# Patient Record
Sex: Female | Born: 1935 | Race: White | Hispanic: No | Smoking: Former smoker
Health system: Southern US, Community
[De-identification: ages and names within clinical notes are randomized; demographics above are authoritative.]

## PROBLEM LIST (undated history)

## (undated) DIAGNOSIS — F329 Major depressive disorder, single episode, unspecified: Secondary | ICD-10-CM

## (undated) DIAGNOSIS — O2230 Deep phlebothrombosis in pregnancy, unspecified trimester: Secondary | ICD-10-CM

## (undated) DIAGNOSIS — F32A Depression, unspecified: Secondary | ICD-10-CM

## (undated) DIAGNOSIS — J45909 Unspecified asthma, uncomplicated: Secondary | ICD-10-CM

## (undated) DIAGNOSIS — M48061 Spinal stenosis, lumbar region without neurogenic claudication: Secondary | ICD-10-CM

## (undated) DIAGNOSIS — E785 Hyperlipidemia, unspecified: Secondary | ICD-10-CM

## (undated) DIAGNOSIS — M419 Scoliosis, unspecified: Secondary | ICD-10-CM

## (undated) DIAGNOSIS — F0390 Unspecified dementia without behavioral disturbance: Secondary | ICD-10-CM

## (undated) DIAGNOSIS — D649 Anemia, unspecified: Secondary | ICD-10-CM

## (undated) DIAGNOSIS — M810 Age-related osteoporosis without current pathological fracture: Secondary | ICD-10-CM

## (undated) DIAGNOSIS — R4702 Dysphasia: Secondary | ICD-10-CM

## (undated) DIAGNOSIS — I10 Essential (primary) hypertension: Secondary | ICD-10-CM

## (undated) DIAGNOSIS — E039 Hypothyroidism, unspecified: Secondary | ICD-10-CM

## (undated) DIAGNOSIS — F419 Anxiety disorder, unspecified: Secondary | ICD-10-CM

## (undated) DIAGNOSIS — K219 Gastro-esophageal reflux disease without esophagitis: Secondary | ICD-10-CM

## (undated) HISTORY — PX: OTHER SURGICAL HISTORY: SHX169

---

## 2004-05-14 ENCOUNTER — Ambulatory Visit: Payer: Self-pay | Admitting: Family Medicine

## 2004-10-28 ENCOUNTER — Ambulatory Visit: Payer: Self-pay | Admitting: Gastroenterology

## 2005-04-21 ENCOUNTER — Ambulatory Visit: Payer: Self-pay | Admitting: Anesthesiology

## 2005-04-29 ENCOUNTER — Ambulatory Visit: Payer: Self-pay | Admitting: Anesthesiology

## 2005-05-18 ENCOUNTER — Ambulatory Visit: Payer: Self-pay | Admitting: Unknown Physician Specialty

## 2005-10-27 ENCOUNTER — Ambulatory Visit: Payer: Self-pay | Admitting: Unknown Physician Specialty

## 2005-11-24 ENCOUNTER — Other Ambulatory Visit: Payer: Self-pay

## 2005-12-01 ENCOUNTER — Inpatient Hospital Stay: Payer: Self-pay | Admitting: Unknown Physician Specialty

## 2006-05-19 ENCOUNTER — Ambulatory Visit: Payer: Self-pay | Admitting: Unknown Physician Specialty

## 2006-12-21 ENCOUNTER — Ambulatory Visit: Payer: Self-pay | Admitting: Internal Medicine

## 2007-02-01 ENCOUNTER — Ambulatory Visit: Payer: Self-pay | Admitting: Internal Medicine

## 2007-05-24 ENCOUNTER — Ambulatory Visit: Payer: Self-pay | Admitting: Internal Medicine

## 2008-05-25 ENCOUNTER — Ambulatory Visit: Payer: Self-pay | Admitting: Internal Medicine

## 2008-08-12 ENCOUNTER — Emergency Department: Payer: Self-pay | Admitting: Emergency Medicine

## 2009-05-27 ENCOUNTER — Ambulatory Visit: Payer: Self-pay | Admitting: Internal Medicine

## 2009-07-28 ENCOUNTER — Emergency Department: Payer: Self-pay | Admitting: Internal Medicine

## 2009-08-02 ENCOUNTER — Emergency Department: Payer: Self-pay | Admitting: Unknown Physician Specialty

## 2009-08-09 ENCOUNTER — Inpatient Hospital Stay: Payer: Self-pay | Admitting: Internal Medicine

## 2009-10-01 ENCOUNTER — Ambulatory Visit: Payer: Self-pay | Admitting: Unknown Physician Specialty

## 2012-01-19 ENCOUNTER — Emergency Department: Payer: Self-pay | Admitting: Emergency Medicine

## 2012-02-01 ENCOUNTER — Inpatient Hospital Stay: Payer: Self-pay | Admitting: Internal Medicine

## 2012-02-01 LAB — CBC WITH DIFFERENTIAL/PLATELET
Basophil %: 0.7 %
Eosinophil #: 0.3 10*3/uL (ref 0.0–0.7)
Eosinophil %: 2 %
HCT: 41 % (ref 35.0–47.0)
HGB: 13.7 g/dL (ref 12.0–16.0)
Lymphocyte #: 1.8 10*3/uL (ref 1.0–3.6)
MCH: 29.5 pg (ref 26.0–34.0)
MCHC: 33.5 g/dL (ref 32.0–36.0)
Monocyte #: 0.7 x10 3/mm (ref 0.2–0.9)
Monocyte %: 4.9 %
Neutrophil %: 79.8 %
Platelet: 248 10*3/uL (ref 150–440)
RBC: 4.65 10*6/uL (ref 3.80–5.20)

## 2012-02-01 LAB — COMPREHENSIVE METABOLIC PANEL
Alkaline Phosphatase: 168 U/L — ABNORMAL HIGH (ref 50–136)
Anion Gap: 14 (ref 7–16)
BUN: 29 mg/dL — ABNORMAL HIGH (ref 7–18)
Bilirubin,Total: 1 mg/dL (ref 0.2–1.0)
Calcium, Total: 9.9 mg/dL (ref 8.5–10.1)
Co2: 23 mmol/L (ref 21–32)
EGFR (African American): 40 — ABNORMAL LOW
Osmolality: 288 (ref 275–301)
SGOT(AST): 17 U/L (ref 15–37)
SGPT (ALT): 16 U/L (ref 12–78)
Sodium: 139 mmol/L (ref 136–145)
Total Protein: 7.2 g/dL (ref 6.4–8.2)

## 2012-02-01 LAB — URINALYSIS, COMPLETE
Bilirubin,UR: NEGATIVE
Glucose,UR: NEGATIVE mg/dL (ref 0–75)
Hyaline Cast: 3
Nitrite: POSITIVE
Protein: 30
Specific Gravity: 1.023 (ref 1.003–1.030)

## 2012-02-01 LAB — TSH: Thyroid Stimulating Horm: 12.8 u[IU]/mL — ABNORMAL HIGH

## 2012-02-01 LAB — TROPONIN I: Troponin-I: 0.67 ng/mL — ABNORMAL HIGH

## 2012-02-01 LAB — APTT: Activated PTT: 131.2 secs — ABNORMAL HIGH (ref 23.6–35.9)

## 2012-02-02 LAB — LIPID PANEL
HDL Cholesterol: 35 mg/dL — ABNORMAL LOW (ref 40–60)
Ldl Cholesterol, Calc: 106 mg/dL — ABNORMAL HIGH (ref 0–100)
Triglycerides: 91 mg/dL (ref 0–200)
VLDL Cholesterol, Calc: 18 mg/dL (ref 5–40)

## 2012-02-02 LAB — CBC WITH DIFFERENTIAL/PLATELET
HCT: 34.9 % — ABNORMAL LOW (ref 35.0–47.0)
Lymphocyte #: 1.8 10*3/uL (ref 1.0–3.6)
Lymphocyte %: 20.8 %
Monocyte %: 7.1 %
Neutrophil %: 68.3 %
Platelet: 163 10*3/uL (ref 150–440)
RDW: 14.5 % (ref 11.5–14.5)
WBC: 8.7 10*3/uL (ref 3.6–11.0)

## 2012-02-02 LAB — APTT
Activated PTT: >160 secs (ref 23.6–35.9)
Activated PTT: >160 secs (ref 23.6–35.9)
Activated PTT: 125.5 secs — ABNORMAL HIGH (ref 23.6–35.9)

## 2012-02-02 LAB — BASIC METABOLIC PANEL
BUN: 25 mg/dL — ABNORMAL HIGH (ref 7–18)
Chloride: 108 mmol/L — ABNORMAL HIGH (ref 98–107)
Creatinine: 1.19 mg/dL (ref 0.60–1.30)
EGFR (African American): 49 — ABNORMAL LOW
EGFR (Non-African Amer.): 42 — ABNORMAL LOW
Glucose: 126 mg/dL — ABNORMAL HIGH (ref 65–99)
Osmolality: 287 (ref 275–301)
Potassium: 3.9 mmol/L (ref 3.5–5.1)

## 2012-02-02 LAB — TROPONIN I: Troponin-I: 0.67 ng/mL — ABNORMAL HIGH

## 2012-02-02 LAB — HEMOGLOBIN A1C: Hemoglobin A1C: 5.2 % (ref 4.2–6.3)

## 2012-02-02 LAB — PROTIME-INR
INR: 1.1
Prothrombin Time: 14.4 secs (ref 11.5–14.7)

## 2012-02-03 LAB — BASIC METABOLIC PANEL
Anion Gap: 7 (ref 7–16)
Calcium, Total: 8.5 mg/dL (ref 8.5–10.1)
Chloride: 112 mmol/L — ABNORMAL HIGH (ref 98–107)
Co2: 25 mmol/L (ref 21–32)
Creatinine: 1.18 mg/dL (ref 0.60–1.30)
EGFR (African American): 49 — ABNORMAL LOW
Osmolality: 288 (ref 275–301)

## 2012-02-03 LAB — PROTIME-INR
INR: 1
Prothrombin Time: 14 secs (ref 11.5–14.7)

## 2012-02-03 LAB — BUN: BUN: 12 mg/dL (ref 7–18)

## 2012-02-03 LAB — APTT
Activated PTT: 50.1 secs — ABNORMAL HIGH (ref 23.6–35.9)
Activated PTT: 50 secs — ABNORMAL HIGH (ref 23.6–35.9)

## 2012-02-03 LAB — CREATININE, SERUM: EGFR (Non-African Amer.): 43 — ABNORMAL LOW

## 2012-02-04 LAB — PROTIME-INR
INR: 1.1
Prothrombin Time: 14.3 secs (ref 11.5–14.7)

## 2012-02-04 LAB — APTT: Activated PTT: 109.4 secs — ABNORMAL HIGH (ref 23.6–35.9)

## 2012-02-05 LAB — PROTIME-INR
INR: 1.4
Prothrombin Time: 17.7 secs — ABNORMAL HIGH (ref 11.5–14.7)

## 2012-02-06 LAB — APTT: Activated PTT: 78.4 secs — ABNORMAL HIGH (ref 23.6–35.9)

## 2012-02-06 LAB — HEMOGLOBIN: HGB: 11.5 g/dL — ABNORMAL LOW (ref 12.0–16.0)

## 2012-02-07 LAB — APTT: Activated PTT: 38.3 secs — ABNORMAL HIGH (ref 23.6–35.9)

## 2012-02-07 LAB — PROTIME-INR
INR: 2.8
Prothrombin Time: 29.4 secs — ABNORMAL HIGH (ref 11.5–14.7)

## 2012-02-07 LAB — HEMOGLOBIN: HGB: 11.6 g/dL — ABNORMAL LOW (ref 12.0–16.0)

## 2014-05-27 ENCOUNTER — Inpatient Hospital Stay: Payer: Self-pay | Admitting: Internal Medicine

## 2014-05-27 LAB — COMPREHENSIVE METABOLIC PANEL
ALT: 27 U/L
ANION GAP: 6 — AB (ref 7–16)
Albumin: 3.6 g/dL (ref 3.4–5.0)
Alkaline Phosphatase: 96 U/L
BILIRUBIN TOTAL: 0.5 mg/dL (ref 0.2–1.0)
BUN: 26 mg/dL — ABNORMAL HIGH (ref 7–18)
CHLORIDE: 107 mmol/L (ref 98–107)
CREATININE: 1.42 mg/dL — AB (ref 0.60–1.30)
Calcium, Total: 8.8 mg/dL (ref 8.5–10.1)
Co2: 26 mmol/L (ref 21–32)
EGFR (African American): 45 — ABNORMAL LOW
EGFR (Non-African Amer.): 37 — ABNORMAL LOW
Glucose: 164 mg/dL — ABNORMAL HIGH (ref 65–99)
Osmolality: 286 (ref 275–301)
Potassium: 4.5 mmol/L (ref 3.5–5.1)
SGOT(AST): 26 U/L (ref 15–37)
Sodium: 139 mmol/L (ref 136–145)
TOTAL PROTEIN: 6.9 g/dL (ref 6.4–8.2)

## 2014-05-27 LAB — URINALYSIS, COMPLETE
BILIRUBIN, UR: NEGATIVE
GLUCOSE, UR: NEGATIVE mg/dL (ref 0–75)
Nitrite: NEGATIVE
PH: 5 (ref 4.5–8.0)
Protein: 30
Specific Gravity: 1.017 (ref 1.003–1.030)
Squamous Epithelial: 13

## 2014-05-27 LAB — CBC
HCT: 40.5 % (ref 35.0–47.0)
HGB: 13.1 g/dL (ref 12.0–16.0)
MCH: 29.5 pg (ref 26.0–34.0)
MCHC: 32.4 g/dL (ref 32.0–36.0)
MCV: 91 fL (ref 80–100)
PLATELETS: 208 10*3/uL (ref 150–440)
RBC: 4.45 10*6/uL (ref 3.80–5.20)
RDW: 15.2 % — ABNORMAL HIGH (ref 11.5–14.5)
WBC: 16 10*3/uL — AB (ref 3.6–11.0)

## 2014-05-27 LAB — PROTIME-INR
INR: 2
Prothrombin Time: 22 secs — ABNORMAL HIGH (ref 11.5–14.7)

## 2014-05-27 LAB — TROPONIN I: Troponin-I: <0.02 ng/mL

## 2014-05-28 LAB — HEMOGLOBIN: HGB: 12 g/dL (ref 12.0–16.0)

## 2014-05-28 LAB — CBC WITH DIFFERENTIAL/PLATELET
BASOS ABS: 0 10*3/uL (ref 0.0–0.1)
Basophil %: 0.4 %
EOS PCT: 0.7 %
Eosinophil #: 0.1 10*3/uL (ref 0.0–0.7)
HCT: 35.1 % (ref 35.0–47.0)
HGB: 11.4 g/dL — ABNORMAL LOW (ref 12.0–16.0)
LYMPHS ABS: 1.5 10*3/uL (ref 1.0–3.6)
Lymphocyte %: 13.7 %
MCH: 29.7 pg (ref 26.0–34.0)
MCHC: 32.6 g/dL (ref 32.0–36.0)
MCV: 91 fL (ref 80–100)
Monocyte #: 0.7 x10 3/mm (ref 0.2–0.9)
Monocyte %: 6.8 %
Neutrophil #: 8.6 10*3/uL — ABNORMAL HIGH (ref 1.4–6.5)
Neutrophil %: 78.4 %
PLATELETS: 172 10*3/uL (ref 150–440)
RBC: 3.85 10*6/uL (ref 3.80–5.20)
RDW: 15 % — ABNORMAL HIGH (ref 11.5–14.5)
WBC: 11 10*3/uL (ref 3.6–11.0)

## 2014-05-28 LAB — BASIC METABOLIC PANEL
Anion Gap: 6 — ABNORMAL LOW (ref 7–16)
BUN: 19 mg/dL — ABNORMAL HIGH (ref 7–18)
CO2: 26 mmol/L (ref 21–32)
Calcium, Total: 8.2 mg/dL — ABNORMAL LOW (ref 8.5–10.1)
Chloride: 108 mmol/L — ABNORMAL HIGH (ref 98–107)
Creatinine: 1.19 mg/dL (ref 0.60–1.30)
EGFR (African American): 55 — ABNORMAL LOW
GFR CALC NON AF AMER: 46 — AB
GLUCOSE: 122 mg/dL — AB (ref 65–99)
Osmolality: 283 (ref 275–301)
Potassium: 4.2 mmol/L (ref 3.5–5.1)
Sodium: 140 mmol/L (ref 136–145)

## 2014-05-28 LAB — CLOSTRIDIUM DIFFICILE(ARMC)

## 2014-05-28 LAB — PROTIME-INR
INR: 2.3
Prothrombin Time: 24.9 secs — ABNORMAL HIGH (ref 11.5–14.7)

## 2014-05-29 LAB — PROTIME-INR
INR: 1.5
Prothrombin Time: 18 secs — ABNORMAL HIGH (ref 11.5–14.7)

## 2014-05-29 LAB — URINE CULTURE

## 2014-05-30 LAB — STOOL CULTURE

## 2014-05-30 LAB — WBCS, STOOL

## 2014-06-04 ENCOUNTER — Inpatient Hospital Stay: Payer: Self-pay | Admitting: Internal Medicine

## 2014-06-04 LAB — CBC WITH DIFFERENTIAL/PLATELET
Basophil #: 0.1 10*3/uL (ref 0.0–0.1)
Basophil %: 0.9 %
Eosinophil #: 0.5 10*3/uL (ref 0.0–0.7)
Eosinophil %: 5.8 %
HCT: 33.7 % — AB (ref 35.0–47.0)
HGB: 11.2 g/dL — ABNORMAL LOW (ref 12.0–16.0)
LYMPHS ABS: 1.8 10*3/uL (ref 1.0–3.6)
Lymphocyte %: 21.1 %
MCH: 29.8 pg (ref 26.0–34.0)
MCHC: 33.2 g/dL (ref 32.0–36.0)
MCV: 90 fL (ref 80–100)
Monocyte #: 0.7 x10 3/mm (ref 0.2–0.9)
Monocyte %: 8.8 %
Neutrophil #: 5.3 10*3/uL (ref 1.4–6.5)
Neutrophil %: 63.4 %
PLATELETS: 216 10*3/uL (ref 150–440)
RBC: 3.76 10*6/uL — AB (ref 3.80–5.20)
RDW: 15.8 % — ABNORMAL HIGH (ref 11.5–14.5)
WBC: 8.4 10*3/uL (ref 3.6–11.0)

## 2014-06-04 LAB — URINALYSIS, COMPLETE
Bilirubin,UR: NEGATIVE
GLUCOSE, UR: NEGATIVE mg/dL (ref 0–75)
KETONE: NEGATIVE
NITRITE: NEGATIVE
Ph: 5 (ref 4.5–8.0)
Protein: 30
RBC,UR: 30 /HPF (ref 0–5)
Specific Gravity: 1.02 (ref 1.003–1.030)
Squamous Epithelial: 2

## 2014-06-04 LAB — COMPREHENSIVE METABOLIC PANEL
Albumin: 2.9 g/dL — ABNORMAL LOW (ref 3.4–5.0)
Alkaline Phosphatase: 52 U/L
Anion Gap: 6 — ABNORMAL LOW (ref 7–16)
BILIRUBIN TOTAL: 0.6 mg/dL (ref 0.2–1.0)
BUN: 23 mg/dL — AB (ref 7–18)
Calcium, Total: 8.2 mg/dL — ABNORMAL LOW (ref 8.5–10.1)
Chloride: 112 mmol/L — ABNORMAL HIGH (ref 98–107)
Co2: 26 mmol/L (ref 21–32)
Creatinine: 1.02 mg/dL (ref 0.60–1.30)
EGFR (African American): >60
EGFR (Non-African Amer.): 55 — ABNORMAL LOW
GLUCOSE: 102 mg/dL — AB (ref 65–99)
Osmolality: 291 (ref 275–301)
Potassium: 3.3 mmol/L — ABNORMAL LOW (ref 3.5–5.1)
SGOT(AST): 40 U/L — ABNORMAL HIGH (ref 15–37)
SGPT (ALT): 30 U/L
SODIUM: 144 mmol/L (ref 136–145)
Total Protein: 5.9 g/dL — ABNORMAL LOW (ref 6.4–8.2)

## 2014-06-04 LAB — TROPONIN I: Troponin-I: 0.02 ng/mL

## 2014-06-04 LAB — LIPASE, BLOOD: Lipase: 173 U/L (ref 73–393)

## 2014-06-05 LAB — CBC WITH DIFFERENTIAL/PLATELET
BASOS ABS: 0.1 10*3/uL (ref 0.0–0.1)
Basophil %: 1.1 %
Eosinophil #: 0.5 10*3/uL (ref 0.0–0.7)
Eosinophil %: 6.1 %
HCT: 32.3 % — ABNORMAL LOW (ref 35.0–47.0)
HGB: 11 g/dL — AB (ref 12.0–16.0)
Lymphocyte #: 1.4 10*3/uL (ref 1.0–3.6)
Lymphocyte %: 17.8 %
MCH: 30.3 pg (ref 26.0–34.0)
MCHC: 34 g/dL (ref 32.0–36.0)
MCV: 89 fL (ref 80–100)
Monocyte #: 0.6 x10 3/mm (ref 0.2–0.9)
Monocyte %: 8.1 %
NEUTROS ABS: 5.2 10*3/uL (ref 1.4–6.5)
Neutrophil %: 66.9 %
Platelet: 202 10*3/uL (ref 150–440)
RBC: 3.62 10*6/uL — AB (ref 3.80–5.20)
RDW: 15.6 % — ABNORMAL HIGH (ref 11.5–14.5)
WBC: 7.8 10*3/uL (ref 3.6–11.0)

## 2014-06-05 LAB — BASIC METABOLIC PANEL
ANION GAP: 8 (ref 7–16)
BUN: 19 mg/dL — ABNORMAL HIGH (ref 7–18)
CALCIUM: 7.6 mg/dL — AB (ref 8.5–10.1)
Chloride: 114 mmol/L — ABNORMAL HIGH (ref 98–107)
Co2: 24 mmol/L (ref 21–32)
Creatinine: 0.95 mg/dL (ref 0.60–1.30)
EGFR (African American): >60
EGFR (Non-African Amer.): 59 — ABNORMAL LOW
Glucose: 99 mg/dL (ref 65–99)
Osmolality: 293 (ref 275–301)
Potassium: 3.3 mmol/L — ABNORMAL LOW (ref 3.5–5.1)
Sodium: 146 mmol/L — ABNORMAL HIGH (ref 136–145)

## 2014-06-06 LAB — BASIC METABOLIC PANEL
Anion Gap: 6 — ABNORMAL LOW (ref 7–16)
BUN: 14 mg/dL (ref 7–18)
CALCIUM: 7.5 mg/dL — AB (ref 8.5–10.1)
CHLORIDE: 113 mmol/L — AB (ref 98–107)
Co2: 24 mmol/L (ref 21–32)
Creatinine: 0.88 mg/dL (ref 0.60–1.30)
EGFR (African American): >60
EGFR (Non-African Amer.): >60
Glucose: 111 mg/dL — ABNORMAL HIGH (ref 65–99)
Osmolality: 286 (ref 275–301)
Potassium: 3.5 mmol/L (ref 3.5–5.1)
Sodium: 143 mmol/L (ref 136–145)

## 2014-06-06 LAB — URINE CULTURE

## 2014-10-02 NOTE — Consult Note (Signed)
General Aspect DVT with bilateral PE evaluate for IVC filter    Present Illness 81 year old female with a history of hypertension, asthma, hyperthyroidism, lumbar scoliosis, lower back pain, osteoporosis presented to the ED 6 days ago with shortness of breath.   Her husband found her breath, so her husband called EMS and brought patient to ED for further evaluation. Patient was noted to have elevated troponin at 0.53, was treated with aspirin 325 mg. Further work up revealed bilateral PE and DVT of thelower extremity.  She was placed on heparin and bridged to Coumadin.  At this time she is therapeutic on Coumadin and has not had any significant complications from her anticoagulation therapy.  PAST MEDICAL HISTORY: As mentioned above:  1. Hypertension. 2. Asthma. 3. Hypothyroidism.  4. Gastroesophageal reflux disease. 5. Osteoporosis.  6. Chronic back pain. 7. Lumbar scoliosis. 8. Possible anxiety and depression.   Home Medications: Medication Instructions Status  albuterol-ipratropium 2.5 mg-0.5 mg/3 mL inhalation solution  inhaled  Active  metoprolol tartrate 25 mg oral tablet 1 tab(s) orally 2 times a day Active  simvastatin 40 mg oral tablet 1 tab(s) orally once a day (at bedtime) Active  warfarin 5 mg oral tablet 0.5 tab(s) orally once a day x 30 days Active  nitroglycerin 0.4 mg sublingual tablet 1 tab(s) sublingual , As needed, chest pain Active  lisinopril 5 mg oral tablet 1 tab(s) orally once a day Active  ferrous sulfate 325 mg (65 mg elemental iron) oral tablet 1 tab(s) orally once a day Active  risperidone 0.25 mg oral tablet 0.5 tab(s) orally 2 times a day Active  levothyroxine 200 mcg (0.2 mg) oral tablet 1 tab(s) orally once a day Active    PCN: Rash  Case History:   Family History Non-Contributory    Social History negative tobacco, negative ETOH, negative Illicit drugs   Review of Systems:   ROS Pt not able to provide ROS  confused   Physical Exam:   GEN  well developed, no acute distress    HEENT PERRL, hearing intact to voice    NECK supple  trachea midline    RESP normal resp effort  no use of accessory muscles    CARD regular rate  no JVD    ABD denies tenderness  soft  nondistended    EXTR negative cyanosis/clubbing, positive edema    SKIN No rashes, No ulcers, skin turgor poor    NEURO cranial nerves intact, follows commands, motor/sensory function intact    PSYCH alert, poor insight   Nursing/Ancillary Notes: **Vital Signs.:   25-Aug-13 08:05   Vital Signs Type Routine   Temperature Temperature (F) 97.7   Celsius 36.5   Temperature Source oral   Pulse Pulse 80   Respirations Respirations 18   Systolic BP Systolic BP 563   Diastolic BP (mmHg) Diastolic BP (mmHg) 65   Mean BP 90   Pulse Ox % Pulse Ox % 97   Pulse Ox Activity Level  At rest   Oxygen Delivery Room Air/ 21 %   Routine Chem:  21-Aug-13 05:26    Glucose, Serum  125   BUN 12   Creatinine (comp) 1.18   Sodium, Serum 144   Potassium, Serum 3.7   Chloride, Serum  112   CO2, Serum 25   Calcium (Total), Serum 8.5   Anion Gap 7   Osmolality (calc) 288   eGFR (African American)  49   eGFR (Non-African American)  43 (eGFR values <20m/min/1.73 m2  may be an indication of chronic kidney disease (CKD). Calculated eGFR is useful in patients with stable renal function. The eGFR calculation will not be reliable in acutely ill patients when serum creatinine is changing rapidly. It is not useful in  patients on dialysis. The eGFR calculation may not be applicable to patients at the low and high extremes of body sizes, pregnant women, and vegetarians.)  Routine Coag:  25-Aug-13 04:08    Activated PTT (APTT)  38.3 (A HCT value >55% may artifactually increase the APTT. In one study, the increase was an average of 19%. Reference: "Effect on Routine and Special Coagulation Testing Values of Citrate Anticoagulant Adjustment in Patients with High HCT  Values." American Journal of Clinical Pathology 2006;126:400-405.)   Prothrombin  29.4   INR 2.8 (INR reference interval applies to patients on anticoagulant therapy. A single INR therapeutic range for coumarins is not optimal for all indications; however, the suggested range for most indications is 2.0 - 3.0. Exceptions to the INR Reference Range may include: Prosthetic heart valves, acute myocardial infarction, prevention of myocardial infarction, and combinations of aspirin and anticoagulant. The need for a higher or lower target INR must be assessed individually. Reference: The Pharmacology and Management of the Vitamin K  antagonists: the seventh ACCP Conference on Antithrombotic and Thrombolytic Therapy. TMHDQ.2229 Sept:126 (3suppl): [id number]. A HCT value >55% may artifactually increase the PT.  In one study,  the increase was an average of 25%. Reference:  "Effect on Routine and Special Coagulation Testing Values of Citrate Anticoagulant Adjustment in Patients with High HCT Values." American Journal of Clinical Pathology 7989;211:941-740.)  Routine Hem:  25-Aug-13 04:08    Hemoglobin (CBC)  11.6 (Result(s) reported on 07 Feb 2012 at 05:07AM.)   Platelet Count (CBC) 220 (Result(s) reported on 07 Feb 2012 at 05:07AM.)     Impression 1.  Acute PE            Currently the patient is therapeutic on Coumadin            There are no immediate complications at this tiem            I would not place an IVC filter at this time            If the patient is to be managed at home by her husband then I would consider transitioning to Xarelto as this is easier to manage (one pill once a day with no variation) 2.  Acute DVT             plan as above 3   COPD/Asthma             continue home medications 4.  GERD             continue PPI    Plan level 3 consult   Electronic Signatures: Hortencia Pilar (MD)  (Signed 25-Aug-13 12:48)  Authored: General Aspect/Present Illness, Home  Medications, Allergies, History and Physical Exam, Vital Signs, Labs, Impression/Plan   Last Updated: 25-Aug-13 12:48 by Hortencia Pilar (MD)

## 2014-10-02 NOTE — Consult Note (Signed)
PATIENT NAME:  Katherine [name], Katherine [name] MR#:  [id number] DATE OF BIRTH:  1925/12/24  DATE OF CONSULTATION:  02/01/2012  REFERRING PHYSICIAN:  Dr. Imogene Burn  CONSULTING PHYSICIAN:  Verta Ellen, PA-C  PRIMARY CARE PHYSICIAN: Dr. Arlana Pouch   REASON FOR CONSULTATION: Acute myocardial infarction.   HISTORY OF PRESENT ILLNESS: Ms. Katherine [name] is an 81 year old white female with multiple medical problems including hypertension, hypothyroidism, lumbar scoliosis, chronic low back pain, osteoporosis, and dysphagia presented to the ED with worsening shortness of breath this morning. Patient's husband says she has chronic shortness of breath that was acute and 911 was summoned. Patient currently denied any chest pain, chest pressure, jaw pain or left arm pain. She did not have any diaphoresis during the episode this morning. She does not have any known heart problems.   PAST MEDICAL HISTORY:  1. Hypertension.  2. History of asthma (patient unaware of this and denies).  3. Hypothyroidism.  4. Gastroesophageal reflux disease.  5. Osteoporosis.  6. Chronic back pain.  7. Lumbar scoliosis.  8. Possible anxiety and depression.  9. History of herpes zoster.   PAST SURGICAL HISTORY:  1. Lumbar decompression and fusion in 2007.  2. Left cataract extraction surgery in 2003.   ALLERGIES: Penicillin.   HOME MEDICATIONS: See list.    CURRENT MEDICATIONS: Medications at this time include:  1. Levothyroxine. 2. Lisinopril 5 mg daily. 3. Metoprolol tartrate 25 mg b.i.d.  4. Pantoprazole 40 mg q.a.m. 5. Risperidone 1 mg/mL 0.125 b.i.d.  6. Simvastatin 40 mg at bedtime.   SOCIAL HISTORY: The patient is married. She is a former smoker and quit smoking 50 years ago. Does not use any alcohol or illicit drugs. She uses one cup of caffeinated beverage per day.   FAMILY HISTORY: Patient's father died of a myocardial infarction in his 83s. No history of CVA. Aunt with diabetes mellitus.   REVIEW OF SYSTEMS:  CONSTITUTIONAL: Patient denies any fever. She has had some anorexia. No chills. Denies headache, dizziness, sinus pressure, double vision. RESPIRATORY: Positive for shortness of breath. CARDIOVASCULAR: Negative for chest pain, palpitations, orthopnea. Only mild leg edema in the summer months. GASTROINTESTINAL: No nausea, vomiting, diarrhea, abdominal pain.   PHYSICAL EXAMINATION:  GENERAL: This is an elderly female who is in mild distress, currently on nasal cannula.   VITAL SIGNS: Temperature 96.5 degrees Fahrenheit, heart rate 122, respiratory rate 20, blood pressure 114/77, oxygen saturation 98% on 2 L/min.   HEENT: Head atraumatic, normocephalic. Eyes: Pupils are round, equal, reactive to light. Conjunctivae pale, pink. No scleral icterus. Ears and nose are normal to external inspection. Mouth: Good dentition, some dry mucous membranes.   NECK: Supple. Trachea is midline. Thyroid is smooth and mobile.   LUNGS: Clear to auscultation bilaterally. No adventitious breath sounds appreciated. No accessory muscle use.   CARDIOVASCULAR: Regular rhythm. No murmurs, rubs, or gallops appreciated. No carotid bruit appreciated.   ABDOMEN: Soft, nondistended. Bowel sounds present in all four quadrants. Abdomen is soft and nondistended with no rebound tenderness, guarding, peritoneal signs, or hepatosplenomegaly.   EXTREMITIES: No cyanosis, clubbing, or edema.  LABORATORY, DIAGNOSTIC AND RADIOLOGICAL DATA: EKG on admission with sinus tachycardia, 129 beats per minute, right bundle branch block, nonspecific ST changes, review of telemetry with sinus tachycardia, 117 beats per minute. Chest x-ray consistent with chronic obstructive pulmonary disease.   Glucose 182, BNP 316, BUN 29, creatinine 1.40, sodium 139, potassium 4.5, chloride 102, CO2 23, estimated GFR 35, total protein 7.2, albumin 4.0, total bilirubin 1.8, alkaline  phosphatase 168, AST 17, ALT 16, troponin I 0.53. TSH is 12.80. White blood cell  count 14.3, hemoglobin 13.7, hematocrit 41.0, platelet count 248,000. D-dimer is greater than 6. Urinalysis with 3+ bacteria.   ASSESSMENT/PLAN: Acute shortness of breath with elevated troponin I and d-dimer. Patient has elevated troponin and will need to rule out acute myocardial infarction. She denies any chest pain or chest pressure at this time. Agree with echocardiogram to rule out wall motion abnormality and check LVEF. Agree with patient being started on heparin drip, aspirin, lisinopril, beta blocker. Due to elevated d-dimer also need to rule out acute pulmonary embolism. A V/Q scan has been ordered. After reviewing her echocardiogram, V/Q scan, and following her troponins decision will be made whether to proceed with cardiac catheterization. Will continue supportive care and continue to follow this patient with you.   Thank you very much for this consultation and allowing us to participate in this patient's care.  ____________________________ Verta EllenMonica A. Domino Holten, PA-C mam:cms D: 02/01/2012 12:37:08 ET T: 02/01/2012 13:11:00 ET JOB#: [id number]  cc: Verta EllenMonica A. Izel Eisenhardt, PA-C, <Dictator> Jillene Bucksenny C. Arlana Pouchate, MD Reese Stockman A [facility] PA ELECTRONICALLY SIGNED 02/03/2012 10:26

## 2014-10-02 NOTE — H&P (Signed)
PATIENT NAME:  Katherine [name], Katherine [name] MR#:  [id number] DATE OF BIRTH:  11-20-1925  DATE OF ADMISSION:  02/01/2012  PRIMARY CARE PHYSICIAN: Dr. Arlana Pouchate  REFERRING PHYSICIAN: Dr. Darnelle CatalanMalinda  CHIEF COMPLAINT: Shortness of breath this morning.   HISTORY OF PRESENT ILLNESS: 81 year old Caucasian female with a history of hypertension, asthma, hyperthyroidism, lumbar scoliosis, lower back pain, osteoporosis presented to the ED with shortness of breath this morning. Patient is alert, awake, oriented. She only complains of shortness of breath this morning and denies any other symptoms. According to her husband who is living with her at home patient developed shortness of breath early this morning. She went to her husband's room and lying on the bed grasping breath, so her husband called EMS and brought patient to ED for further evaluation. Patient was noted to have elevated troponin at 0.53, was treated with aspirin 325 mg. [facility] due to elevated troponin.   PAST MEDICAL HISTORY: As mentioned above:  1. Hypertension. 2. Asthma. 3. Hypothyroidism.  4. Gastroesophageal reflux disease. 5. Osteoporosis.  6. Chronic back pain. 7. Lumbar scoliosis. 8. Possible anxiety and depression.   SOCIAL HISTORY: Denies any smoking, alcohol drinking or illicit drugs.   PAST SURGICAL HISTORY: Cataract surgery.   FAMILY HISTORY: Father had heart attack at 8852.   REVIEW OF SYSTEMS: CONSTITUTIONAL: Patient denies any fever, chills. No headache or dizziness but has weakness. EYES: No double vision, blurred vision. ENT: No epistaxis, postnasal drip, slurred speech, or dysphagia. RESPIRATORY: Positive for shortness of breath, but no cough, sputum, wheezing or hemoptysis. CARDIOVASCULAR: No chest pain, palpitation, orthopnea, or nocturnal dyspnea. No leg edema. GASTROINTESTINAL: No nausea, vomiting, diarrhea or abdominal pain. No melena or bloody stools. GENITOURINARY: No dysuria, hematuria, or  incontinence but according to patient's daughter patient has urine frequency. ENDOCRINE: No polyuria, polydipsia, heat or cold intolerance. HEMATOLOGY: No easy bruising, bleeding. NEUROLOGY: No syncope, loss of consciousness or seizure   PHYSICAL EXAMINATION:  VITAL SIGNS: Temperature 97.9, blood pressure 143/74, pulse 124, respirations 20, oxygen saturation 97% on room air.   GENERAL: Patient is alert, awake, oriented in no acute distress.   HEENT: Pupils round, equal, reactive to light and accommodation. Moist oral mucosa. Clear oropharynx.   NECK: Supple. No JVD or carotid bruits. No lymphadenopathy. No thyromegaly.   CARDIOVASCULAR: S1, S2 regular rate, rhythm. No murmurs, gallops.   PULMONARY: Bilateral air entry. No wheezing or rales.   ABDOMEN: Soft. No distention. No tenderness. No organomegaly. Bowel sounds present.   EXTREMITIES: No edema, clubbing, or cyanosis. No calf tenderness. Strong bilateral pedal pulses.   SKIN: No rash or jaundice.   NEUROLOGY: Alert and oriented x3. No focal deficits. Power 5/5. Sensation intact.   LABORATORY, DIAGNOSTIC, AND RADIOLOGICAL DATA:  Urinalysis showed nitrite is positive, WBC 85, RBC 19.   CBC showed WBC 14.3, hemoglobin 13.7, hematocrit 41, platelets 248, glucose 182, BUN 29, creatinine 1.4. Electrolytes are normal. D-dimer more than 6.0. Troponin 0.53. TSH 12.8. BNP 316. Chest x-ray consistent with chronic obstructive pulmonary disease but no congestive heart failure or pneumonia.   EKG shows sinus tachycardia at 129 beats per minute with right bundle branch block.   IMPRESSION:  1. Acute myocardial infarction. 2. Shortness of breath, need to rule out pulmonary embolus.   3. Acute renal failure with dehydration. 4. Urinary tract infection.  5. Leukocytosis.  6. Hypothyroidism.  7. History of asthma. 8. Gastroesophageal reflux disease. 9. Back pain. 10. Osteoporosis.   PLAN OF TREATMENT:  1. Patient will be admitted to the  telemetry floor. Will continue aspirin 325 mg p.o. daily and start heparin drip. Will give her nitro p.r.n. and start Lopressor, lisinopril and Zocor and follow up troponin level, lipid panel and hemoglobin A1c.   2. Will get V/Q scan to rule out PE and get echocardiogram and cardiology consult.  3. For urinary tract infection, we will start Cipro and follow-up CBC and urine culture.  4. For acute renal failure, dehydration, we will start IV fluid and follow up BMP.  5. For hypothyroidism, continue Synthroid. 6. GI and deep vein thrombosis prophylaxis.   Discussed the patient's critical situation and the plan of treatment with patient's sister, son and husband and also discussed with Dr. Darnelle Catalan.   TIME SPENT: About 65 minutes.   ____________________________ Shaune Pollack, MD qc:cms D: 02/01/2012 11:27:32 ET T: 02/01/2012 12:11:35 ET JOB#: [id number]  cc: Shaune Pollack, MD, <Dictator> Jillene Bucks. Arlana Pouch, MD Shaune Pollack MD ELECTRONICALLY SIGNED 02/01/2012 15:00

## 2014-10-02 NOTE — Discharge Summary (Signed)
PATIENT NAME:  Katherine [name], Katherine [name] MR#:  [id number] DATE OF BIRTH:  1925-09-06  DATE OF ADMISSION:  02/01/2012 DATE OF DISCHARGE:  02/07/2012  ADMITTING DIAGNOSIS: Acute myocardial infarction.   DISCHARGE DIAGNOSES:  1. Non-Q-wave myocardial infarction, likely type II due to pulmonary embolism.  2. Pulmonary embolism, now anticoagulated with Coumadin therapy with most recent pro-time of 29.4, INR 2.8. 3. Left lower extremity deep vein thrombosis.  4. Tachycardia due to pulmonary embolism.  5. Acute renal failure.  6. Dehydration, resolved.  7. Urinary tract infection, Escherichia coli, with minimal colony-forming units.  8. Leukocytosis. 9. History of hypothyroidism. 10. Asthma. 11. Chronic obstructive pulmonary disease.   DISCHARGE CONDITION: Stable.   DISCHARGE MEDICATIONS: The patient is to continue:  1. Levothyroxine 200 mcg p.o. daily.  2. Iron sulfate 325 mg p.o. daily.  3. Risperidone 0.125 mg p.o. twice daily.  4. Lisinopril 5 mg p.o. daily.  5. Nitroglycerin 0.4 mg sublingually as needed for chest pain.  6. Warfarin 2.5 mg p.o. daily.  7. Simvastatin 40 mg p.o. at bedtime.  8. Albuterol ipratropium 3 mL solution inhalation as needed.  9. Metoprolol tartrate 50 mg p.o. twice daily.  10. Aspirin 81 mg p.o. daily.   DO NOT TAKE: The patient is not to take atenolol which is 25 mg which was given in the past and was 25 mg p.o. daily dose.   HOME OXYGEN: None.   DIET: 2 gram salt, low fat, low cholesterol, carbohydrate controlled diet, consistency regular.   ACTIVITY LIMITATIONS: As tolerated.   REFERRAL: Home health physical therapy as well as [facility].  FOLLOW-UP: Follow-up appointment with Dr. Arlana Pouch in two days after discharge. Also, have pro-time and INR checked on Monday, 02/08/2012, and report to primary care physician, Dr. Arlana Pouch.   CONSULTANTS:  1. Dr. Gilda Crease   2. Dr. Adrian Blackwater  3. Dr. Ned Clines  4. Care Management  5. [place], [place]   RADIOLOGICAL  STUDIES:  1. Chest x-ray, portable, single view, 02/01/2012, showed findings consistent with COPD. No evidence of CHF or pneumonia. Subtle nodularity projects over the posterior aspect of the left 7th rib in the perihilar region. There is stable since exam on 11/24/2005.  2. Chest, one view, 02/02/2012, for V/Q scan findings are consistent with COPD or reactive airway disease. Minimal prominence of central pulmonary vascularity. No significant alveolar interstitial edema demonstrated according to the radiologist. 3. V/Q scan was done on 02/02/2012 showed findings consistent with high probability of acute pulmonary embolism. 4. Ultrasound of bilateral lower extremities 02/03/2012 showed near occlusive left lower extremity deep vein thrombosis. No evidence of DVT in the right lower extremity.  5. Echocardiogram 02/01/2012 showed dilated right side of heart without pulmonary hypertension. Left heart normal size and function but does have wall motion abnormalities suggestive of coronary artery disease. The patient does have moderate to severe posterior wall hypokinesis. Left ventricle is grossly normal size. Left ventricular systolic function normal. Ejection fraction more than or equal to 55%. Left atrium mildly dilated. Right atrium is moderately dilated. Right ventricular systolic pressure is normal. Trace aortic regurgitation was noted. This was read by Dr. Adrian Blackwater.   REASON FOR ADMISSION: The patient is an 81 year old Caucasian female who presented to the hospital with complaints of sudden onset of shortness of breath on the morning of admission. Please refer to Dr. Nicky Pugh admission note on 02/01/2012. She admitted of shortness of breath, however, denied any symptoms. Apparently she was laying in bed gasping for breath and so  her husband called EMS and the patient was brought to the Emergency Room for further evaluation. In the Emergency Room she was noted to have mild elevation of troponin.    PHYSICAL EXAMINATION: Her vital signs showed a temperature of 97.9, pulse was 124, respiration rate was 20 and above, blood pressure was 143/74, and oxygen saturation was 97% on room air. The patient's physical exam showed good bilateral air entry on he lung exam with no significant wheezes or rales.   LABORATORY DATA: 02/01/2012 elevated glucose to 182, BUN and creatinine were 29 and 1.40, otherwise BMP was unremarkable. The patient's liver enzymes showed mild elevation of alkaline phosphatase at 168, otherwise unremarkable. Troponin initially on the first set was slightly elevated at 0.53, on the second set was 0.67, and on the third set was 0.67. TSH was checked and was found to be high at 12.8. White blood cell count was elevated to 14.3, hemoglobin was 13.7, platelet count 248. Absolute neutrophil count was also elevated to 11.4. The patient's D-dimer was very high at 6.0. The patient's urinalysis revealed amber turbid urine, negative for glucose or bilirubin, 1+ ketones, specific gravity was 1.023, pH 5.0, 1+ blood, 30 mg/dL protein, positive for nitrites, 1+ leukocyte esterase, 19 red blood cells, 85 white blood cells, 3+ bacteria, and also epithelial cells and transitional epithelial cells. White blood cell clumps were present as well as mucous, hyaline cast as well as amorphous crystals.   EKG showed sinus tachy at 129 beats per minute, right bundle branch block, nonspecific ST-T changes.   HOSPITAL COURSE: The patient was admitted to the hospital. Her chest x-ray was unremarkable so further investigation to rule out pulmonary embolism was entertained. The patient was initiated on heparin drip and underwent V/Q scanning of her chest. V/Q scan revealed high probability of pulmonary embolism and Coumadin therapy was initiated. The patient also got Doppler ultrasound of her lower extremities and it showed left lower extremity near occlusive disease. The patient was anticoagulated and did very well  with this. 1. In regards to pulmonary embolism, as mentioned above the patient was initiated on heparin drip and Coumadin therapy was initiated. The patient was evaluated by Dr. Meredeth IdeFleming who recommended heparin and Coumadin therapy overlap. The patient became therapeutic with Coumadin and she is advised to continue therapy with Coumadin and follow-up with her primary care physician for further recommendations in regards to Coumadin dosing. Her pro-time is therapeutic now and her INR is 2.8 today on day of discharge, 02/07/2012. She is also to continue simvastatin to decrease risks of clots in the future. Because of her sedentary lifestyle, physical therapist was also invited to help her and she was advised to have physical therapy at home which will be prescribed for her upon discharge.  2. In regards to tachycardia, it was felt that the patient's tachycardia was related to pulmonary embolism. She was treated with beta-blockers which were advanced to current levels. On the day of discharge the patient's heart rate was in the 70's to 80's. 3. For elevated troponin, it was felt to be type II non-Q-wave MI possibly related to pulmonary embolism. The patient's echocardiogram was unremarkable and Dr. Welton FlakesKhan, cardiologist, felt that patient should continue therapy with Coumadin and follow-up with him as outpatient. No cardiac catheterization was advised because of the patient's advanced age as well as he was off Coumadin therapy. 4. For acute renal failure, it was felt to be likely dehydration related. The patient was given some IV fluids and the  patient's kidney function normalized. On 02/02/2012 the patient's BUN was slightly elevated at 25 and creatinine was 1.19. It is recommended to follow the patient's BUN and creatinine levels and make sure that she is not getting significantly dehydrated. However, the patient's creatinine could have been also elevated due to urinary tract infection. The patient was noted to have  Escherichia coli, however, only 50,000 colony forming units were noted in her urine culture. The patient was given therapy of ciprofloxacin and Ciprofloxacin was discontinued. She did not have any other symptoms.  5. The patient had some leukocytosis. On admission to the hospital white blood cell count was elevated and also she had left shift, however, it was felt to be likely related to stress reaction. The patient's white blood cell count was rechecked on 02/02/2012 and was within normal limits at 8.7. Left shift also resolved.  6. For history of hypothyroidism, the patient is to continue Synthroid. The patient's TSH was found to be high, however, because of the patient's unstable hemodynamic status during this admission decision was made not to advance her levothyroxine dose at this point. It is recommended to recheck the patient's TSH as outpatient and possibly advance levothyroxine. It is also recommended to discuss with the patient and her family how levothyroxine should be dosed. It should be given during fasting state and no fluid should be given after Synthroid is taken. 7. For asthma/COPD, the patient is to continue her outpatient medications with DuoNebs.  DISPOSITION: The patient is being discharged in stable condition with the above-mentioned medications and follow-up.   Her vital signs were stable. On day of discharge temperature was 98.0, pulse 74, respiration rate 18, blood pressure 120/72, saturation 97% on room air at rest.   Of note, the patient was also evaluated by Dr. Gilda Crease, however, Dr. Gilda Crease felt that the patient does not need IVC filter  because of fully anticoagulated state and being clinically  stable.  ____________________________ Katharina Caper, MD rv:drc D: 02/07/2012 19:23:05 ET T: 02/08/2012 11:18:20 ET JOB#: [id number]  cc: Katharina Caper, MD, <Dictator> Jillene Bucks. Arlana Pouch, MD Katharina Caper MD ELECTRONICALLY SIGNED 02/24/2012 22:05

## 2014-10-02 NOTE — Consult Note (Signed)
PATIENT NAME:  Katherine [name], Katherine [name] MR#:  [id number] DATE OF BIRTH:  [birth year]  DATE OF CONSULTATION:  02/03/2012  CONSULTING PHYSICIAN:  Hawley Michel E. Meredeth IdeFleming, MD  CHIEF COMPLAINT: Following up for pulmonary embolism, left deep venous thrombosis.   PROGRESS NOTE: Ms. Katherine [name] had a very high probability V/Q scan. Her venous Doppler showed near occlusive left deep venous thrombosis on heparin. Coumadin was started and she is tolerating well, no bleeding. She was a little confused at the time I saw her around 8:00. She did not voice any new cardiopulmonary symptoms, however.   REVIEW OF SYSTEMS: No headache, dizziness, passing out spells. No bleeding. No sore throat, change in voice. Not having any nausea, vomiting, blood in stool, dysuria, flank pain. She did speak with the nurse.   PHYSICAL EXAMINATION:  VITAL SIGNS: Temperature 97.9, pulse 80, respirations 19, blood pressure 151/76, oxygen saturation 98% on 2 liters.   GENERAL: A pleasant lady, quite cooperative, sitting in bed intermittently asking to go home.   HEENT: Normocephalic, nontraumatic. Extraocular movements are intact. Nares is benign,   Oropharynx is benign.   NECK: Supple. No jugular venous distention. No thyromegaly.   LUNGS: Bilateral breath sounds. I did not hear any rubs.   CARDIAC: Regular rate and rhythm. No obvious murmur or gallop.   ABDOMEN: Soft, nontender.   EXTREMITIES: No edema, cyanosis, Homans sign.   SKIN: No rashes, nonhealing ulcers, bruise on the right arm but no petechiae.   LYMPH: No nodes.  NEUROLOGICAL:  Cranial nerves: Raises extremities against gravity.  Follows simple commands.   PSYCHIATRIC: Alert, intermittently confused.   LABORATORY, DIAGNOSTIC AND RADIOLOGICAL DATA: PTT 50. Glucose 125, BUN 12, creatinine 1.18, sodium 144, potassium 3.7, chloride 112, CO2 25.   IMPRESSION/PLAN: High probability V/Q scan with left deep venous thrombosis consistent with pulmonary embolism. I agree with  heparin and Coumadin. Continue titrating Coumadin as you are doing. I am following with you as well.   ____________________________ Clenton PareHerbon E. Meredeth IdeFleming, MD hef:cbb D: 02/04/2012 16:50:00 ET T: 02/04/2012 17:54:52 ET JOB#: [id number]  cc: Dyann Goodspeed E. Meredeth IdeFleming, MD, <Dictator> Mertie MooresHERBON E Delando Satter MD ELECTRONICALLY SIGNED 02/16/2012 18:45

## 2014-10-02 NOTE — Consult Note (Signed)
Patient denies chest pain and is less short of breath, and has finally agreed to have VQ scan. Will decide about cath , if VQ scan is negative. But if VQ scan is positive ,for PE, would cancel cardiac cath and treat as such for PE.  Electronic Signatures: Radene KneeKhan, Tashonna Descoteaux Ali (MD)  (Signed on 20-Aug-13 10:52)  Authored  Last Updated: 20-Aug-13 10:52 by Radene KneeKhan, Jerie Basford Ali (MD)

## 2014-10-02 NOTE — Consult Note (Signed)
Brief Consult Note: Diagnosis: SOB with elevated TNI, D-dimer-need to r/o MI and PE.   Comments: Patient denies CP at this time. She is on heparin gtt, statin, beta-blocker. ACE-I. Will await echo, V/Q scan results to r/o PE and check wall motion before scheduling any invasive prcoedures such as cardiac cath. Will follow the patient with you.  Electronic Signatures: Radene KneeKhan, Shaukat Ali (MD)   (Signed [phone]-Aug-13 09:43)  Co-Signer: Brief Consult Note Verta EllenManzi, Anyae Griffith A (PA-C)   (Signed 19-Aug-13 12:29)  Authored: Brief Consult Note  Last Updated: 20-Aug-13 09:43 by Radene KneeKhan, Shaukat Ali (MD)

## 2014-10-06 NOTE — Consult Note (Signed)
See NP note.  Pt likely with ischemic colitis in desc-sigmoid area, classical location due to decreased blood flow in this area.  Minimal tenderness on my exam.  Doing as well as expected.  Would continue antibiotics and switch to oral when she is taking diet well.  No further testing needed.  Would repeat U/A to see if cipro hit the bacteria there also.  Will follow with you.  Electronic Signatures: Scot JunElliott, Davone Shinault T (MD)  (Signed on 14-Dec-15 18:40)  Authored  Last Updated: 14-Dec-15 18:40 by Scot JunElliott, Jabari Swoveland T (MD)

## 2014-10-06 NOTE — H&P (Signed)
PATIENT NAME:  Katherine [name], Katherine [name] MR#:  [id number] DATE OF BIRTH:  01-Nov-1925  DATE OF ADMISSION:  05/27/2014  PRIMARY CARE PHYSICIAN: Katherina Rightenny C. Arlana Pouchate, MD   CHIEF COMPLAINT: Bright red blood per rectum.   HISTORY OF PRESENT ILLNESS: This is an 81 year old female who had 2 episodes of bright red blood per rectum, could not really quantify how much blood came out; first episode quite a bit of blood mixed with diarrhea. No nausea or vomiting. No abdominal pain. I asked her if she had diarrhea for the previous few days. She says occasionally she has some diarrhea. The patient is not the best historian. The patient is on Coumadin for a history of DVT and pulmonary embolism back in August 2013. Hospitalist services were contacted for further evaluation when a CT scan showed a severe colitis involving the distal descending and sigmoid colon; could be infectious, inflammatory, or ischemic.   PAST MEDICAL HISTORY: DVT, PE back in 2013; coronary artery disease; hypothyroidism; hypertension.   PAST SURGICAL HISTORY: Back surgery, hysterectomy.   ALLERGIES: PENICILLIN.   MEDICATIONS: Include levothyroxine 88 mcg daily; lisinopril 5 mg daily; metoprolol 50 mg every 12 hours; Risperdal 0.25 mg 1/2 tablet twice a day; warfarin 5 mg, 1 tablet by mouth every Tuesday, Thursday, and Sunday, 1/2 tablet on other days.   SOCIAL HISTORY: No smoking. No alcohol. No drug use. Used to work in a [employer]. She lives with her husband.   FAMILY HISTORY: Father died of lung cancer. Mother died of old age.  REVIEW OF SYSTEMS:  CONSTITUTIONAL: Positive for fatigue. No fever, chills, or sweats. No weight loss. No weight gain.  EYES: She does wear glasses. EARS, NOSE, MOUTH AND THROAT: Decreased hearing, positive runny nose. No sore throat. No difficulty swallowing.  CARDIOVASCULAR: No chest pain. No palpitations.  RESPIRATORY: No shortness of breath. No cough. No sputum. No hemoptysis.  GASTROINTESTINAL: No nausea. No  vomiting. No abdominal pain. Positive for diarrhea with bright red blood per rectum.  GENITOURINARY: No burning on urination or hematuria.  MUSCULOSKELETAL: No joint pain or muscle pain.  INTEGUMENT: No rashes or eruptions.  NEUROLOGICAL: No fainting or blackouts.  PSYCHIATRIC: Positive for depression.  ENDOCRINE: Positive for hypothyroidism. HEMATOLOGIC AND LYMPHATIC: No anemia.   PHYSICAL EXAMINATION:  VITAL SIGNS: Temperature 97.7, pulse 88, respirations 16, blood pressure 159/85, pulse oximetry 96% on room air.  GENERAL: No respiratory distress.  EYES: Conjunctivae and lids normal. Pupils equal, round, and reactive to light. Extraocular muscles intact. No nystagmus. EARS, NOSE, MOUTH, AND THROAT: Tympanic membranes: No erythema. Nasal mucosa: No erythema. Throat: No erythema. No exudate seen. Lips and gums: No lesions.  NECK: No JVD. No bruits. No lymphadenopathy. No thyromegaly. No thyroid nodules palpated.  RESPIRATORY: Lungs clear to auscultation. No use of accessory muscles to breathe. No rhonchi, rales, or wheeze heard.  CARDIOVASCULAR: S1, S2 normal. No gallops, rubs, or murmurs heard. Carotid upstroke 2+ bilaterally. No bruits. Pulses 2+ bilaterally. Trace edema of the lower extremities.   ABDOMEN: Soft, nontender. No organomegaly or splenomegaly. Normoactive bowel sounds. No masses felt.  LYMPHATIC: No lymph nodes in the neck.  MUSCULOSKELETAL: Trace edema. No clubbing. No cyanosis.  SKIN: No rashes or ulcers seen.  NEUROLOGIC: Cranial nerves II through XII grossly intact. Deep tendon reflexes 2+ in bilateral lower extremities.  PSYCHIATRIC: The patient is oriented to person, place, and time.   LABORATORY AND RADIOLOGICAL DATA: CT scan of the abdomen and pelvis showed findings consistent with severe colitis involving the  distal descending and sigmoid colon; may be inflammatory infectious in origin, but ischemic colitis cannot be excluded. A 5 mm nodule noted laterally in the left  lower lobe.   ASSESSMENT AND PLAN:  1.  Acute colitis with bleeding; could be infectious versus ischemic colitis. We will give empiric Cipro and Flagyl, send off stool studies. Clear liquid diet. Continue to monitor closely. 2.  Coagulopathy with Coumadin. We will give vitamin K x 1. Since the patient's pulmonary embolism was 2 years ago, the patient may be able to come off the Coumadin.  3.  History of deep vein thrombosis and pulmonary embolism. This has been over 2 years ago. Hopefully the patient will be able to come off Coumadin.  4.  Hypertension. Continue lisinopril and metoprolol.  5.  Hypothyroidism. Continue levothyroxine.  6.  Positive urinalysis. Will send off a urine culture. The Cipro would cover anyway.  7.  Pulmonary nodule seen on CT scan. Can repeat a CT scan in 12 months.  TIME SPENT ON ADMISSION: 55 minutes.  CODE STATUS: The patient is a full code.    ____________________________ Herschell Dimes. Renae Gloss, MD rjw:ST D: 05/27/2014 21:22:06 ET T: 05/27/2014 21:33:56 ET JOB#: [id number]  cc: Herschell Dimes. Renae Gloss, MD, <Dictator> Jillene Bucks. Arlana Pouch, MD Salley Scarlet MD ELECTRONICALLY SIGNED 06/06/2014 15:43

## 2014-10-06 NOTE — Consult Note (Signed)
Pt seen with son.  Her abd is not tender at this time,  Pt eating full liquid diet ok.  From my standpoint she can go to rehab tomorrow.  Electronic Signatures: Scot JunElliott, Orphia Mctigue T (MD)  (Signed on 15-Dec-15 18:27)  Authored  Last Updated: 15-Dec-15 18:27 by Scot JunElliott, Xareni Kelch T (MD)

## 2014-10-06 NOTE — Consult Note (Signed)
PATIENT NAME:  Katherine [name], Katherine [name] MR#:  [id number] DATE OF BIRTH:  [birth year]  DATE OF CONSULTATION:  05/28/2014  REFERRING PHYSICIAN:  Sarah "Sallie" Allena KatzPatel, MD CONSULTING PHYSICIAN:  Ranae PlumberKimberly A. Arvilla MarketMills, ANP (Adult Nurse Practitioner)  CONSULTING PHYSICIAN: Lynnae Prudeobert Elliott, MD/Keayra Graham Arvilla MarketMills, ANP.   PRIMARY CARE PHYSICIAN: Dewaine Oatsenny Tate, MD   REASON FOR CONSULTATION: Bloody stools.   HISTORY OF PRESENT ILLNESS: This 81 year old patient was admitted to the hospital yesterday with 2 episodes of bright red blood per rectum. She could not quantify. She is unable to give me history. The patient does not think she had any rectal bleeding. She denies any bleeding today. She denies any problems with her bowels today. Nursing staff reports that she has had persistent small amounts of darkish blood on her pad every time it has been changed today. She has been up to the bedside commode with diarrhea. The patient denies any abdominal pain, nausea or vomiting.  She is on chronic Coumadin with history of DVT and pulmonary embolism in August 2013. Her admitting pro time was 22 and INR 2.0. Her Coumadin is on hold. A CT of the abdomen and pelvis with contrast performed on admission showed severe colitis involving the distal descending and sigmoid colon. Etiology to rule out inflammatory versus infection but ischemic colitis cannot be excluded. The patient has been placed on IV Cipro and Flagyl. She says she is feeling better. She continues to deny abdominal pain.   PAST MEDICAL HISTORY:  1.  DVT.  2.  Pulmonary embolus 2013.  3.  Coronary artery disease.  4.  Hypothyroidism.  5.  Hypertension.   PAST SURGICAL HISTORY:  1.  Back surgery.  2.  Hysterectomy.  3.  Colonoscopy in 2006.   HOME MEDICATIONS: Per admission list. The patient does not know what she takes.  1.  Levothyroxine 88 mcg daily.  2.  Lisinopril 5 mg daily.  3.  Metoprolol 50 mg every 12 hours.  4.  Risperdal 0.25 mg half tablet twice a day.   5.  Warfarin 5 mg 1 tablet every Tuesday, Thursday, Sunday; 1/2 tablet on other days.   SOCIAL HISTORY: Negative for tobacco or alcohol or drug use. Lives with her husband.   FAMILY HISTORY: Father deceased with lung cancer.   REVIEW OF SYSTEMS: Positive for fatigue. The patient denies other review of systems, but I do not think she is an accurate historian at this time. Her husband is not present. Marland Kitchen.   PHYSICAL EXAMINATION: VITAL SIGNS: Temperature 97.8, pulse 76, respirations 18, blood pressure 116/63. Pulse oximetry on room air is 95%.  GENERAL: Elderly Caucasian female resting in bed, very pleasant, NAD.  HEENT: Shows head is normocephalic. Conjunctivae pink. Sclerae anicteric. Oral mucosa is moist, intact.  NECK: Supple. Trachea is midline.  CARDIAC: S1, S2 without murmur or gallop.  LUNGS: CTA. Respirations are nonlabored.  ABDOMEN: Soft. Bowel sounds are present. Nontender in all quadrants.  RECTAL: Shows fresh blood on the pad. Internal exam not performed. EXTREMITIES: Lower extremities without edema.  SKIN: Warm and dry without rash.  NEUROLOGIC: The patient has apparent poor memory. She moves extremities x 4, assist with position changes.  PSYCHIATRIC: Very pleasant and cooperative.   LABORATORY DATA: Admission blood work with BUN 26, creatinine 1.42, albumin 3.6. Liver panel normal. Troponin less than 0.02. WBC 16.0, hemoglobin 13.1. Pro time 22.0, INR 2.0. Urine culture no growth. Clostridium difficile negative. Urinalysis was positive for WBC 103 per high-powered field, 1+ bacteria, positive yeast, positive  hyaline casts, positive blood.   FOLLOWUP LABORATORY STUDIES: 05/28/2014 with BUN 19, creatinine 1.19, hemoglobin 11.4 to 12.0. Pro time 24.9, INR 2.3. The patient did receive vitamin K 5 mg oral tablet yesterday.   RADIOLOGY: CT of the abdomen and pelvis performed 05/27/2014 with severe multilevel degenerative disk disease noted in the lumbar spine, moderate  dextroscoliosis of the lower thoracic and upper lumbar spine, a 5 mm nodule noted laterally in the left lower lobe, no gallstone seen. Small left hepatic cyst is noted. Small bilateral renal cysts are noted. Adrenal glands are normal. There is positive severe wall thickening involving the distal portion of the descending colon as well as sigmoid colon with surrounding inflammatory changes noted. This is most consistent with colitis, as mentioned. There is a recommendation for a followup CT for the 5 mm nodule noted in the left lower lobe.   IMPRESSION: The patient has apparent acute lower gastrointestinal bleed, and I did see one of the pads with darkish red stool. This is likely ischemic colitis based on the CT study findings. The patient has no tenderness and no abdominal pain. She is a poor overall historian regarding length of duration of these symptoms. She appears quite comfortable. The etiology to rule out inflammatory versus infectious. She denies that she has been having problems with diarrhea prior to admission. Her husband is not present to give further history.   PLAN: Continue with IV Cipro and Flagyl. Continue with close monitoring regarding hemoglobin and resolution of this rectal bleeding. Her last colonoscopy was in 2006, obtained for screening purposes, and it revealed one 6 mm polyp in the rectum.   This case was discussed with Dr. Mechele Collin in collaboration of care. Further GI recommendations pending her clinical course. Thank you for the consultation.   These services provided by Cala Bradford A. Arvilla Market, MS, APRN, BC, ANP under collaborative agreement with Manfred Shirts, MD.   ____________________________ Ranae Plumber Arvilla Market, ANP (Adult Nurse Practitioner) kam:TT D: 05/28/2014 16:40:57 ET T: 05/28/2014 17:08:48 ET JOB#: [id number]  cc: Cala Bradford A. Arvilla Market, ANP (Adult Nurse Practitioner), <Dictator> Ranae Plumber Suzette Battiest, MSN, ANP-BC Adult Nurse Practitioner ELECTRONICALLY SIGNED 05/29/2014  11:26

## 2014-10-10 NOTE — H&P (Signed)
PATIENT NAME:  Katherine [name], Katherine [name] MR#:  [id number] DATE OF BIRTH:  Jul 21, 1925  DATE OF ADMISSION:  06/04/2014  PRIMARY CARE PHYSICIAN:  Jaclyn Shaggy, MD   CHIEF COMPLAINT: Weakness.   HISTORY OF PRESENT ILLNESS: An 81 year old Caucasian female patient with history of dementia with baseline walks with a cane presents to the Emergency Room, brought by family after they noticed that she has been extremely weak, unable to walk. The patient at baseline tends to walk with a cane stooped. Lives at home with her husband and does have problems with memory, dementia. She was recently in the hospital and discharged on 05/30/2014, after being treated for acute colitis with mild GI bleed that seems to be improving. After working with physical therapy prior to discharge, skilled nursing facility was recommended, but family refused this as they did not understand why this was being recommended. The patient had not improved with her weakness and returns to the Emergency Room and is being admitted to the hospitalist service for weakness. Also, seems to have a UTI, afebrile, normal white count, hemoglobin is stable.   The patient is unable to contribute to history; history has been obtained from family, old records and the ER staff.   PAST MEDICAL HISTORY: 1.  DVT/PE in 2013.  2.  CAD.  3.  Hypothyroidism.  4.  Hypertension.  5.  Arthritis.  6.  Dementia.   PAST MEDICAL HISTORY:  Back surgery and hysterectomy.   ALLERGIES: PENICILLIN.   SOCIAL HISTORY: The patient does not smoke. No alcohol. No illicit drugs. Ambulates with a walker, has dementia, lives at home with husband, seems to be independent with activities of daily living.   FAMILY HISTORY: Father died of lung cancer; mother had old age.   REVIEW OF SYSTEMS: Unobtainable from patient due to her dementia.   HOME MEDICATIONS: 1.  Ciprofloxacin 500 mg 2 times a day started on 05/30/2014.  2.  Flagyl 500 mg 3 times a day started on 05/30/2014.  3.   Levothyroxine 88 mcg daily.  4.  Lisinopril 5 mg daily.  5.  Metoprolol tartrate 50 mg daily.  6.  Risperidone 0.25 mg 1/2 tablet 2 times a day.   PHYSICAL EXAMINATION: VITAL SIGNS: Temperature 98.7, pulse 75, blood pressure 148/67, saturating 96% on room air.  GENERAL: Elderly Caucasian female patient, lying in bed, seems comfortable.  PSYCHIATRIC: Alert, awake, not oriented to place or time, but oriented to person, pleasant. HEENT: Atraumatic, normocephalic, oral mucosa dry and pink. No pallor. No icterus. Pupils bilaterally equal and reactive to light.  NECK: Supple. No thyromegaly. No palpable lymph nodes. Trachea midline. No carotid bruit or JVD.  CARDIOVASCULAR: S1, S2, without any murmurs. Peripheral pulses 2+. No edema.  RESPIRATORY: Normal work of breathing. Clear to auscultation on both sides.  GASTROINTESTINAL: Soft abdomen, tenderness in the lower abdomen and suprapubic area. No rigidity or guarding. Bowel sounds present. No hepatosplenomegaly palpable.  GENITOURINARY: No CVA tenderness.  MUSCULOSKELETAL: No joint swelling, redness, effusion of the large joints, has some pain and decreased range of motion in her right hip which seems to be chronic but no swelling or redness. No external/internal rotation found.  NEUROLOGICAL: Motor strength 5/5, in upper and lower extremities, limited in the right lower extremity with hip pain; sensation to fine touch intact all over.  LYMPHATIC: No cervical lymphadenopathy.   LABORATORY STUDIES:  1.  Glucose of 102, BUN 23, creatinine 1.02, sodium 144, potassium 3.3, chloride 112; AST, ALT, alkaline phosphatase, bilirubin  normal; troponin 0.02, WBC 8.4, hemoglobin 11.2, platelets of 216,000, neutrophils 63%.  2.  Urinalysis shows 70 WBC, and trace bacteria, 2 epithelial cells, mucus and WBC clumps present.  3.  Recent urine culture from 05/27/2014, has mixed flora.  4.  Abdominal x-ray shows nonspecific bowel gas pattern, bilateral osteoarthritis  of the hips, small left pleural effusion.  5.  Right hip x-ray shows osteoarthritis, nothing acute.   ASSESSMENT AND PLAN: 1.  Urinary tract infection with worsening weakness. The patient was recently treated for acute colitis causing her weakness. Skill nursing inpatient facility was recommended but patient refused this prior to discharge; now patient has returned to the Emergency Room with significant weakness, unable to take care of patient at home. The patient is being admitted for urinary tract infection. We will switch her ciprofloxacin to ceftriaxone; send for urine cultures, she is afebrile, normal white count. Have physical therapy see the patient, consult social worker for placement in skilled nursing facility; discussed the plan of care with family.  2.  Acute colitis. Patient's acute colitis seems to be improving. She does not have any significant tenderness. Gastrointestinal bleed has stopped. No fever. We will change the Cipro to ceftriaxone and continue the Flagyl orally.  3.  Hypertension. Continue medications.  4.  Dementia. Watch for any inpatient delirium.  5.  Deep vein thrombosis prophylaxis; sequential compression devices, no heparin Lovenox secondary to the recent gastrointestinal bleed with acute colitis.   TIME SPENT TODAY ON THIS CASE: Was 45 minutes.    ____________________________ Molinda BailiffSrikar R. Klohe Lovering, MD srs:nt D: 06/04/2014 21:18:03 ET T: 06/04/2014 21:56:02 ET JOB#: [id number]  cc: Wardell HeathSrikar R. Larken Urias, MD, <Dictator> Jillene Bucksenny C. Arlana Pouchate, MD Orie FishermanSRIKAR R Zarie Kosiba MD ELECTRONICALLY SIGNED 06/29/2014 12:48

## 2014-10-10 NOTE — Discharge Summary (Signed)
PATIENT NAME:  Katherine [name], Katherine [name] MR#:  [id number] DATE OF BIRTH:  03/16/1926  DATE OF ADMISSION:  06/04/2014 DATE OF DISCHARGE:  06/06/2014  ADMITTING PHYSICIAN: Srikar R. Sudini, MD  DISCHARGING PHYSICIAN: Enid Baasadhika Cattaleya Wien, MD  PRIMARY CARE PHYSICIAN: Katherine BucksDenny C. Arlana Pouchate, MD   CONSULTATIONS IN THE HOSPITAL: None.   DISCHARGE DIAGNOSES:  1.  Urinary tract infection.  2.  Generalized deconditioning with falls at home.  3.  Hypertension.  4.  Hypothyroidism.  5.  Dementia with agitation at times. 6.  Recent colitis on treatment.   DISCHARGE MEDICATIONS: 1.  Metoprolol 50 mg p.o. b.i.d.  2.  Synthroid 88 mcg p.o. q. daily.  3.  Simvastatin 40 mg p.o. at bedtime.  4.  Flagyl 500 mg p.o. q. 8 hours for 2 more days.  5.  Lisinopril 20 mg p.o. q. daily.  6.  Cipro 500 mg p.o. b.i.d. for 2 more days.  7.  Risperidone 0.5 mg 1 tablet p.o. b.i.d.   DISCHARGE DIET: Regular diet.   DISCHARGE ACTIVITY: As tolerated.  FOLLOWUP INSTRUCTIONS:  1.  PCP followup in 1 week.  2.  Physical therapy.  LABORATORIES AND IMAGING STUDIES: Prior to discharge: 1.  WBC 7.8, hemoglobin 11.0, hematocrit 32.3, platelet count is 202,000.   2.  Sodium 143, potassium 3.5, chloride 113, bicarbonate 24, BUN 14, creatinine 0.88, glucose 111 and calcium of 7.5. 3.  Hip x-ray normal. 4.  Urinalysis with leukocyte esterase, WBC, and trace bacteria. Cultures are negative.  BRIEF HOSPITAL COURSE: Ms. Katherine [name] is an 81 year old elderly Caucasian female with past medical history significant for coronary artery disease, hypertension, dementia, who ambulates with a cane at baseline who presents from home secondary to weakness.  1.  Urinary tract infection, likely contributing to weakness at home. Cultures are negative so far. Started on antibiotics. She was recently admitted in the hospital and was on treatment for colitis, which she will continue, which will also cover for her UTI. Her antibiotics will finish off tomorrow  for her colitis and that way she will also finish, a 5-day course for her UTI as well. 2.  Generalized deconditioning worked with physical therapy who has recommended rehabilitation. Because of her underlying dementia, she might be benefitted from going to a long-term care memory unit.  3.  Hypertension. She is on metoprolol and also lisinopril.  4.  Hyperlipidemia. On simvastatin.  5.  Hypothyroidism. On Synthroid.  CODE STATUS: Full code at this time. I discussed with family at the time of discharge.   DISCHARGE CONDITION: Stable.   DISCHARGE DISPOSITION: Skilled nursing facility.  TIME SPENT ON DISCHARGE: 35 minutes.   ____________________________ Enid Baasadhika Christyn Gutkowski, MD rk:ST D: 06/06/2014 13:56:08 ET T: 06/06/2014 14:11:22 ET JOB#: [id number]  cc: Enid Baasadhika Saron Vanorman, MD, <Dictator> Enid BaasADHIKA Angelize Ryce MD ELECTRONICALLY SIGNED 06/19/2014 18:30

## 2014-10-10 NOTE — Discharge Summary (Signed)
PATIENT NAME:  Katherine [name], Katherine [name] MR#:  [id number] DATE OF BIRTH:  11/23/1925  DATE OF ADMISSION:  05/27/2014 DATE OF DISCHARGE:  05/30/2014  PRESENTING COMPLAINT: Bright red blood per rectum.   PRIMARY CARE PHYSICIAN: Jillene BucksDenny C. Arlana Pouchate, MD   DISCHARGE DIAGNOSES:  1.  Lower gastrointestinal bleed due to acute colitis.  2.  Dementia.   CODE STATUS: No code, DNR.   MEDICATIONS:  1.  Risperidone 0.25 mg 1/2 tablet b.i.d.  2.  Lisinopril 5 mg daily.  3.  Metoprolol 50 mg b.i.d.  4.  Levothyroxine 88 mcg daily.  5.  Flagyl 500 mg every 8 hours.  6.  Cipro 500 mg b.i.d.   DIET: Mechanical soft.  FOLLOWUP:  1.  With Scot Junobert T. Elliott, MD 2 to 4 weeks if needed.  2.  With Jillene Bucksenny C. Arlana Pouchate, MD.   BRIEF SUMMARY OF HOSPITAL COURSE: [name] is an 81 year old Caucasian female, who came into the Emergency Room with:  1.  Acute colitis with rectal bleed, could be infectious versus ischemic colitis. The patient was started on IV Cipro, Flagyl. Clear liquid diet was started. She continued to improve and her antibiotics were changed to p.o. She will follow up with GI, Dr. Mechele CollinElliott, as outpatient.  2.  Coagulopathy with Coumadin. Received vitamin K x 1. Since the patient's pulmonary embolism was 2 years ago, the patient was able to come off Coumadin. INR was down to 1.5. 3.  History of DVT and PE 2 years ago. We will leave the patient off Coumadin. This was discussed with the patient's family.  4.  Hypertension. Continue lisinopril and metoprolol.  5.  Hypothyroidism. Continue levothyroxine.  6.  Positive urinalysis. Cipro would cover anyways.  7.  Pulmonary nodules seen on CT scan. Can repeat CT scan in 12 months.   The patient's family declined rehabilitation. Home health physical therapy was arranged.   TIME SPENT: Forty minutes.    ____________________________ Wylie HailSona A. Allena KatzPatel, MD sap:TT D: 06/12/2014 16:12:54 ET T: 06/12/2014 16:40:03 ET JOB#: [id number]  cc: Brooklynne Pereida A. Allena KatzPatel, MD,  <Dictator> Jillene Bucksenny C. Arlana Pouchate, MD Willow OraSONA A Kamaile Zachow MD ELECTRONICALLY SIGNED 06/19/2014 11:08

## 2014-12-15 ENCOUNTER — Emergency Department: Payer: Medicare Other

## 2014-12-15 ENCOUNTER — Emergency Department
Admission: EM | Admit: 2014-12-15 | Discharge: 2014-12-16 | Disposition: A | Discharge status: Home / Self Care | Payer: Medicare Other | Attending: Student | Admitting: Student

## 2014-12-15 ENCOUNTER — Other Ambulatory Visit: Payer: Self-pay

## 2014-12-15 DIAGNOSIS — S41112A Laceration without foreign body of left upper arm, initial encounter: Secondary | ICD-10-CM | POA: Diagnosis not present

## 2014-12-15 DIAGNOSIS — Z880 Allergy status to penicillin: Secondary | ICD-10-CM | POA: Insufficient documentation

## 2014-12-15 DIAGNOSIS — IMO0002 Reserved for concepts with insufficient information to code with codable children: Secondary | ICD-10-CM

## 2014-12-15 DIAGNOSIS — Y9289 Other specified places as the place of occurrence of the external cause: Secondary | ICD-10-CM | POA: Insufficient documentation

## 2014-12-15 DIAGNOSIS — W19XXXA Unspecified fall, initial encounter: Secondary | ICD-10-CM

## 2014-12-15 DIAGNOSIS — W1839XA Other fall on same level, initial encounter: Secondary | ICD-10-CM | POA: Diagnosis not present

## 2014-12-15 DIAGNOSIS — Z23 Encounter for immunization: Secondary | ICD-10-CM | POA: Insufficient documentation

## 2014-12-15 DIAGNOSIS — Y9389 Activity, other specified: Secondary | ICD-10-CM | POA: Insufficient documentation

## 2014-12-15 DIAGNOSIS — F0391 Unspecified dementia with behavioral disturbance: Secondary | ICD-10-CM | POA: Insufficient documentation

## 2014-12-15 DIAGNOSIS — Y998 Other external cause status: Secondary | ICD-10-CM | POA: Diagnosis not present

## 2014-12-15 LAB — CBC WITH DIFFERENTIAL/PLATELET
BASOS PCT: 1 %
Basophils Absolute: 0 10*3/uL (ref 0–0.1)
Eosinophils Absolute: 0.2 10*3/uL (ref 0–0.7)
Eosinophils Relative: 3 %
HEMATOCRIT: 30.7 % — AB (ref 35.0–47.0)
Hemoglobin: 10.2 g/dL — ABNORMAL LOW (ref 12.0–16.0)
Lymphocytes Relative: 21 %
Lymphs Abs: 1.7 10*3/uL (ref 1.0–3.6)
MCH: 30.3 pg (ref 26.0–34.0)
MCHC: 33.2 g/dL (ref 32.0–36.0)
MCV: 91.3 fL (ref 80.0–100.0)
MONO ABS: 0.5 10*3/uL (ref 0.2–0.9)
Monocytes Relative: 6 %
NEUTROS ABS: 5.6 10*3/uL (ref 1.4–6.5)
Neutrophils Relative %: 69 %
Platelets: 234 10*3/uL (ref 150–440)
RBC: 3.37 MIL/uL — AB (ref 3.80–5.20)
RDW: 15.7 % — AB (ref 11.5–14.5)
WBC: 8.1 10*3/uL (ref 3.6–11.0)

## 2014-12-15 LAB — URINALYSIS COMPLETE WITH MICROSCOPIC (ARMC ONLY)
BILIRUBIN URINE: NEGATIVE
Glucose, UA: NEGATIVE mg/dL
KETONES UR: NEGATIVE mg/dL
NITRITE: NEGATIVE
PH: 7 (ref 5.0–8.0)
PROTEIN: 30 mg/dL — AB
Specific Gravity, Urine: 1.016 (ref 1.005–1.030)

## 2014-12-15 LAB — COMPREHENSIVE METABOLIC PANEL
ALK PHOS: 62 U/L (ref 38–126)
ALT: 20 U/L (ref 14–54)
ANION GAP: 9 (ref 5–15)
AST: 21 U/L (ref 15–41)
Albumin: 3.4 g/dL — ABNORMAL LOW (ref 3.5–5.0)
BILIRUBIN TOTAL: 0.3 mg/dL (ref 0.3–1.2)
BUN: 31 mg/dL — AB (ref 6–20)
CO2: 25 mmol/L (ref 22–32)
Calcium: 9.1 mg/dL (ref 8.9–10.3)
Chloride: 107 mmol/L (ref 101–111)
Creatinine, Ser: 1.19 mg/dL — ABNORMAL HIGH (ref 0.44–1.00)
GFR calc non Af Amer: 40 mL/min — ABNORMAL LOW (ref >60)
GFR, EST AFRICAN AMERICAN: 47 mL/min — AB (ref >60)
GLUCOSE: 126 mg/dL — AB (ref 65–99)
POTASSIUM: 4.2 mmol/L (ref 3.5–5.1)
SODIUM: 141 mmol/L (ref 135–145)
Total Protein: 6.4 g/dL — ABNORMAL LOW (ref 6.5–8.1)

## 2014-12-15 MED ORDER — LIDOCAINE-EPINEPHRINE (PF) 1 %-1:200000 IJ SOLN
INTRAMUSCULAR | Status: AC
  Administered 2014-12-15: 30 mL via INTRADERMAL
  Filled 2014-12-15: qty 30

## 2014-12-15 MED ORDER — SODIUM CHLORIDE 0.9 % IV BOLUS (SEPSIS)
500.0000 mL | Freq: Once | INTRAVENOUS | Status: AC
  Administered 2014-12-15: 500 mL via INTRAVENOUS

## 2014-12-15 MED ORDER — LORAZEPAM 2 MG/ML IJ SOLN
0.5000 mg | Freq: Once | INTRAMUSCULAR | Status: AC
  Administered 2014-12-15: 0.5 mg via INTRAVENOUS

## 2014-12-15 MED ORDER — LIDOCAINE-EPINEPHRINE (PF) 1 %-1:200000 IJ SOLN
30.0000 mL | Freq: Once | INTRAMUSCULAR | Status: AC
  Administered 2014-12-15: 30 mL via INTRADERMAL

## 2014-12-15 MED ORDER — LORAZEPAM 2 MG/ML IJ SOLN
INTRAMUSCULAR | Status: AC
  Administered 2014-12-15: 0.5 mg via INTRAVENOUS
  Filled 2014-12-15: qty 1

## 2014-12-15 MED ORDER — TETANUS-DIPHTH-ACELL PERTUSSIS 5-2.5-18.5 LF-MCG/0.5 IM SUSP
INTRAMUSCULAR | Status: AC
  Administered 2014-12-15: 0.5 mL via INTRAMUSCULAR
  Filled 2014-12-15: qty 0.5

## 2014-12-15 MED ORDER — TETANUS-DIPHTH-ACELL PERTUSSIS 5-2.5-18.5 LF-MCG/0.5 IM SUSP
0.5000 mL | Freq: Once | INTRAMUSCULAR | Status: AC
  Administered 2014-12-15: 0.5 mL via INTRAMUSCULAR

## 2014-12-15 NOTE — ED Notes (Signed)
Patient presents to Emergency Department via EMS with complaints of laceration to left arm s/p fall, pt from [facility] memory care unit, staff stated to EMS pt DID NOT hit her head but unsure what struck pt's arm to cause laceration.  Pt relative historian with hx of dementia

## 2014-12-15 NOTE — ED Provider Notes (Addendum)
[facility] Emergency Department Provider Note  ____________________________________________  Time seen: Approximately 11:02 PM  I have reviewed the triage vital signs and the nursing notes.   HISTORY  Chief Complaint Extremity Laceration  Caveat - HPI and ROS limited secondary to the patient's dementia. Partial history of present illness and review of systems obtained from staff at [facility] as well as family at bedside.  HPI Katherine [name] is a 81 y.o. female with history of dementia with behavioral disorder, ulcerative colitis, indwelling Foley catheter for urinary retention, hyperlipidemia, iron deficiency anemia presents for evaluation after witnessed fall [facility]. According to staff at her facility, the patient became argumentative with another person at the facility. She attempted to stand up next to her wheelchair  (which she is not supposed to do) and then slid down onto the floor injuring her left arm when she hit it against the wheelchair. She did not hit her head or lose consciousness. Prior to today she had been in her usual state of health. Last tetanus is unknown.  No past medical history on file.  There are no active problems to display for this patient.   No past surgical history on file.  No current outpatient prescriptions on file.  Allergies Penicillins  No family history on file.  Social History History  Substance Use Topics  . Smoking status: Not on file  . Smokeless tobacco: Not on file  . Alcohol Use: Not on file    Review of Systems Constitutional: No fever/chills Respiratory: Denies shortness of breath. Gastrointestinal:no vomiting.  No diarrhea.    Caveat - HPI and ROS limited secondary to the patient's dementia. Partial history of present illness and review of systems obtained from staff at [facility] as well as family at bedside. ____________________________________________   PHYSICAL  EXAM:  VITAL SIGNS: ED Triage Vitals  Enc Vitals Group     BP 12/15/14 2140 114/63 mmHg     Pulse Rate 12/15/14 2140 68     Resp 12/15/14 2140 18     Temp 12/15/14 2140 97.5 F (36.4 C)     Temp Source 12/15/14 2140 Oral     SpO2 12/15/14 2135 99 %     Weight 12/15/14 2140 118 lb 6.4 oz (53.706 kg)     Height 12/15/14 2140  (1.676 m)     Head Cir --      [facility] --      Pain Score 12/15/14 2142 10     Pain Loc --      Pain Edu? --      Excl. in GC? --     Constitutional: Alert and oriented to self only, demented, intermittently argumentative but  descalates. Well appearing and in no acute distress. Eyes: Conjunctivae are normal. PERRL. EOMI. Head: Atraumatic. Nose: No congestion/rhinnorhea. Mouth/Throat: Mucous membranes are moist.  Oropharynx non-erythematous. Neck: No stridor. No midline C-spine tenderness to palpation Cardiovascular: Normal rate, regular rhythm. Grossly normal heart sounds.  Good peripheral circulation. Respiratory: Normal respiratory effort.  No retractions. Lungs CTAB. Gastrointestinal: Soft and nontender. No distention. No abdominal bruits. No CVA tenderness. Genitourinary: Deferred; chronic indwelling Foley with leg bag is noted. Musculoskeletal: No lower extremity tenderness nor edema.  No joint effusions. Pelvis is stable to rock and compression, full painless active range of motion of bilateral hips, no midline tenderness to palpation throughout the T or L-spine. There is a large stellate, gaping laceration involving the medial aspect of the left  proximal arm just proximal to the elbow revealing subcutaneous fat and the top layer muscle, there are superficial skin tears associated with the elbow which has full range of motion. 2+ left radial pulse. Neurologic:  Normal speech and language. No gross focal neurologic deficits are appreciated. Speech is normal. 5 out of 5 strength in bilateral upper and lower extremities, sensation intact to light touch  throughout. Skin:  Skin is warm, dry and intact. No rash noted. Psychiatric: Mood and affect are normal. Speech and behavior are normal.  ____________________________________________   LABS (all labs ordered are listed, but only abnormal results are displayed)  Labs Reviewed  CBC WITH DIFFERENTIAL/PLATELET - Abnormal; Notable for the following:    RBC 3.37 (*)    Hemoglobin 10.2 (*)    HCT 30.7 (*)    RDW 15.7 (*)    All other components within normal limits  COMPREHENSIVE METABOLIC PANEL - Abnormal; Notable for the following:    Glucose, Bld 126 (*)    BUN 31 (*)    Creatinine, Ser 1.19 (*)    Total Protein 6.4 (*)    Albumin 3.4 (*)    GFR calc non Af Amer 40 (*)    GFR calc Af Amer 47 (*)    All other components within normal limits  URINALYSIS COMPLETEWITH MICROSCOPIC ([facility] ONLY) - Abnormal; Notable for the following:    Color, Urine YELLOW (*)    APPearance CLOUDY (*)    Hgb urine dipstick 1+ (*)    Protein, ur 30 (*)    Leukocytes, UA 3+ (*)    Bacteria, UA RARE (*)    Squamous Epithelial / LPF TOO NUMEROUS TO COUNT (*)    All other components within normal limits   ____________________________________________  EKG  ED ECG REPORT I, Gayla DossGayle, Berk Pilot A, the attending physician, personally viewed and interpreted this ECG.   Date: 12/16/2014  EKG Time: 21:46  Rate: 67  Rhythm: normal sinus rhythm  Axis: normal  Intervals:first-degree A-V block   ST&T Change: No acute ST segment elevation, Q waves in aVL, V2  ____________________________________________  RADIOLOGY  Elbow xray FINDINGS: Negative for fracture, dislocation or radiopaque foreign body. There is no bone lesion or bony destruction.  IMPRESSION: Negative.  ____________________________________________   PROCEDURES  Procedure(s) performed:   LACERATION REPAIR Performed by: Gayla DossGayle, Terin Cragle A Authorized by: Toney RakesGayle, Rafaelita Foister A Consent: Verbal consent obtained from daughter. Risks and benefits:  risks, benefits and alternatives were discussed Consent given by: patient Patient identity confirmed: provided demographic data Prepped and Draped in normal sterile fashion Wound explored  Laceration Location: left arm proximal to elbow  Laceration Length: 6 cm complicated, stellate gaping irregularly shaped laceration with multiple flaps  No Foreign Bodies seen or palpated  Anesthesia: local infiltration  Local anesthetic: lidocaine 1% with epinephrine  Anesthetic total: 9 ml  Irrigation method: syringe Amount of cleaning: standard  Skin closure: horizontal mattress; 4-0 prolene  Number of sutures: 4  Technique: horizontal mattress  Patient tolerance: Patient tolerated the procedure well with no immediate complications.  Critical Care performed: No  ____________________________________________   INITIAL IMPRESSION / ASSESSMENT AND PLAN / ED COURSE  Pertinent labs & imaging results that were available during my care of the patient were reviewed by me and considered in my medical decision making (see chart for details).  Lucia BitterHelen J Mauss is a 81 y.o. female with history of dementia with behavioral disorder, ulcerative colitis, indwelling Foley catheter for urinary retention, hyperlipidemia, iron deficiency anemia presents for  evaluation after witnessed mechanical fall at [facility]. Vital signs stable, she is afebrile, she is at her baseline in terms of mental status. Her exam is notable for isolated superficial injuries about the left elbow. X-ray negative. I repaired her complicated gaping laceration and updated her T dap. There is an additional smaller flap-like superficial laceration associated with the elbow however the patient has paper thin skin and this is not amenable to repair with suture so we'll place Steri-Strips. As return precautions with daughter. The patient will be discharged to [facility]. Daughter is comfortable with the discharge plan. Labs  reviewed and are generally at baseline. Very mild creatinine elevation at 1.19 but baseline is close to 1. Urinalysis appears contaminated, we'll send culture. ____________________________________________   FINAL CLINICAL IMPRESSION(S) / ED DIAGNOSES  Final diagnoses:  Fall, initial encounter  Laceration      Gayla Doss, MD 12/16/14 0134  Gayla Doss, MD 12/16/14 1610  Gayla Doss, MD 12/16/14 9604  Gayla Doss, MD 12/16/14 5409

## 2014-12-16 DIAGNOSIS — S41112A Laceration without foreign body of left upper arm, initial encounter: Secondary | ICD-10-CM | POA: Diagnosis not present

## 2014-12-16 MED ORDER — BACITRACIN ZINC 500 UNIT/GM EX OINT
TOPICAL_OINTMENT | CUTANEOUS | Status: AC
  Administered 2014-12-16: 2 via TOPICAL
  Filled 2014-12-16: qty 1.8

## 2014-12-16 MED ORDER — BACITRACIN 500 UNIT/GM EX OINT
2.0000 "application " | TOPICAL_OINTMENT | Freq: Once | CUTANEOUS | Status: AC
  Administered 2014-12-16: 2 via TOPICAL

## 2014-12-20 LAB — URINE CULTURE: Special Requests: NORMAL

## 2016-07-13 ENCOUNTER — Emergency Department: Payer: Medicare Other

## 2016-07-13 ENCOUNTER — Emergency Department
Admission: EM | Admit: 2016-07-13 | Discharge: 2016-07-13 | Disposition: A | Discharge status: Home / Self Care | Payer: Medicare Other | Attending: Student in an Organized Health Care Education/Training Program | Admitting: Student in an Organized Health Care Education/Training Program

## 2016-07-13 ENCOUNTER — Encounter: Payer: Self-pay | Admitting: Emergency Medicine

## 2016-07-13 DIAGNOSIS — Y92129 Unspecified place in nursing home as the place of occurrence of the external cause: Secondary | ICD-10-CM | POA: Diagnosis not present

## 2016-07-13 DIAGNOSIS — I6782 Cerebral ischemia: Secondary | ICD-10-CM | POA: Insufficient documentation

## 2016-07-13 DIAGNOSIS — Z7901 Long term (current) use of anticoagulants: Secondary | ICD-10-CM | POA: Insufficient documentation

## 2016-07-13 DIAGNOSIS — E039 Hypothyroidism, unspecified: Secondary | ICD-10-CM | POA: Insufficient documentation

## 2016-07-13 DIAGNOSIS — W19XXXA Unspecified fall, initial encounter: Secondary | ICD-10-CM | POA: Diagnosis not present

## 2016-07-13 DIAGNOSIS — Y939 Activity, unspecified: Secondary | ICD-10-CM | POA: Diagnosis not present

## 2016-07-13 DIAGNOSIS — J45909 Unspecified asthma, uncomplicated: Secondary | ICD-10-CM | POA: Diagnosis not present

## 2016-07-13 DIAGNOSIS — I10 Essential (primary) hypertension: Secondary | ICD-10-CM | POA: Diagnosis not present

## 2016-07-13 DIAGNOSIS — Z87891 Personal history of nicotine dependence: Secondary | ICD-10-CM | POA: Insufficient documentation

## 2016-07-13 DIAGNOSIS — S81812A Laceration without foreign body, left lower leg, initial encounter: Secondary | ICD-10-CM | POA: Insufficient documentation

## 2016-07-13 DIAGNOSIS — Y999 Unspecified external cause status: Secondary | ICD-10-CM | POA: Insufficient documentation

## 2016-07-13 DIAGNOSIS — Z23 Encounter for immunization: Secondary | ICD-10-CM | POA: Diagnosis not present

## 2016-07-13 HISTORY — DX: Anxiety disorder, unspecified: F41.9

## 2016-07-13 HISTORY — DX: Hypothyroidism, unspecified: E03.9

## 2016-07-13 HISTORY — DX: Depression, unspecified: F32.A

## 2016-07-13 HISTORY — DX: Spinal stenosis, lumbar region without neurogenic claudication: M48.061

## 2016-07-13 HISTORY — DX: Unspecified asthma, uncomplicated: J45.909

## 2016-07-13 HISTORY — DX: Dysphasia: R47.02

## 2016-07-13 HISTORY — DX: Scoliosis, unspecified: M41.9

## 2016-07-13 HISTORY — DX: Gastro-esophageal reflux disease without esophagitis: K21.9

## 2016-07-13 HISTORY — DX: Anemia, unspecified: D64.9

## 2016-07-13 HISTORY — DX: Hyperlipidemia, unspecified: E78.5

## 2016-07-13 HISTORY — DX: Unspecified dementia without behavioral disturbance: F03.90

## 2016-07-13 HISTORY — DX: Essential (primary) hypertension: I10

## 2016-07-13 HISTORY — DX: Age-related osteoporosis without current pathological fracture: M81.0

## 2016-07-13 HISTORY — DX: Deep phlebothrombosis in pregnancy, unspecified trimester: O22.30

## 2016-07-13 HISTORY — DX: Major depressive disorder, single episode, unspecified: F32.9

## 2016-07-13 MED ORDER — TETANUS-DIPHTH-ACELL PERTUSSIS 5-2.5-18.5 LF-MCG/0.5 IM SUSP
0.5000 mL | Freq: Once | INTRAMUSCULAR | Status: AC
  Administered 2016-07-13: 0.5 mL via INTRAMUSCULAR
  Filled 2016-07-13: qty 0.5

## 2016-07-13 MED ORDER — LIDOCAINE-EPINEPHRINE-TETRACAINE (LET) SOLUTION
3.0000 mL | Freq: Once | NASAL | Status: AC
  Administered 2016-07-13: 3 mL via TOPICAL
  Filled 2016-07-13: qty 3

## 2016-07-13 NOTE — ED Provider Notes (Signed)
[facility] Emergency Department Provider Note    First MD Initiated Contact with Patient 07/13/16 2046     (approximate)  I have reviewed the triage vital signs and the nursing notes.   HISTORY  Chief Complaint Fall  Level V Caveat:  Dementia  HPI Katherine Katherine [name] is a 83 y.o. female Who presents with EMS after an hour was just fall at her nursing facility. Patient is on Coumadin for uncertain cause but does have history of DVT during pregnancy. Patient laceration and skin tear on the left calf. There is no evidence of Katherine [name] trauma. She denies any neck pain. Patient is alert and oriented to person and place but states that his 28. She denies any other discomfort.   Past Medical History:  Diagnosis Date  . Advanced dementia   . Anemia   . Anxiety   . Asthma   . Depression   . DVT (deep vein thrombosis) in pregnancy (HCC)   . Dysphasia   . GERD (gastroesophageal reflux disease)   . Hyperlipidemia   . Hypertension   . Hypothyroidism   . Lumbar scoliosis   . Lumbar stenosis   . Osteoporosis    History reviewed. No pertinent family history. Past Surgical History:  Procedure Laterality Date  . cataract Left   . colonoscopy    . lubar decomp     There are no active problems to display for this patient.     Prior to Admission medications   Not on File    Allergies Penicillins    Social History Social History  Substance Use Topics  . Smoking status: Former Games developer  . Smokeless tobacco: Never Used  . Alcohol use Not on file    Review of Systems Patient denies headaches, rhinorrhea, blurry vision, numbness, shortness of breath, chest pain, edema, cough, abdominal pain, nausea, vomiting, diarrhea, dysuria, fevers, rashes or hallucinations unless otherwise stated above in HPI. ____________________________________________   PHYSICAL EXAM:  VITAL SIGNS: Vitals:   07/13/16 2057  BP: (!) 137/57  Pulse: 79  Resp: 16  Temp: 97.8  F (36.6 C)   Constitutional: Alert, chronically ill appearing and in no acute distress. Eyes: Conjunctivae are normal. PERRL. EOMI. Katherine [name]: Atraumatic. Nose: No congestion/rhinnorhea. Mouth/Throat: Mucous membranes are moist.  Oropharynx non-erythematous. Neck: No stridor. Painless ROM. No cervical spine tenderness to palpation Hematological/Lymphatic/Immunilogical: No cervical lymphadenopathy. Cardiovascular: Normal rate, regular rhythm. Grossly normal heart sounds.  Good peripheral circulation. Respiratory: Normal respiratory effort.  No retractions. Lungs CTAB. Gastrointestinal: Soft and nontender. No distention. No abdominal bruits. No CVA tenderness. Foley cath in place Musculoskeletal: No lower extremity tenderness with exception of skin tear to left calf, hemostatic  No joint effusions. Neurologic:  No gross focal neurologic deficits are appreciated.  Skin:  Skin is warm, dry and intact. No rash noted. Psychiatric: Mood and affect are normal. Speech and behavior are normal.  ____________________________________________   LABS (all labs ordered are listed, but only abnormal results are displayed)  No results found for this or any previous visit (from the past 24 hour(s)). ____________________________________________  EKG ____________________________________________  RADIOLOGY  I personally reviewed all radiographic images ordered to evaluate for the above acute complaints and reviewed radiology reports and findings.  These findings were personally discussed with the patient.  Please see medical record for radiology report.  ____________________________________________   PROCEDURES  Procedure(s) performed:  Marland KitchenMarland KitchenLaceration Repair Date/Time: 07/13/2016 9:54 PM Performed by: Willy Eddy Authorized by: Willy Eddy   Anesthesia (see MAR for  exact dosages):    Anesthesia method:  Topical application   Topical anesthetic:  LET Laceration details:    Location:   Leg   Leg location:  L lower leg   Length (cm):  4   Depth (mm):  3 Pre-procedure details:    Preparation:  Patient was prepped and draped in usual sterile fashion Exploration:    Hemostasis achieved with:  LET   Wound exploration: wound explored through full range of motion     Contaminated: no   Treatment:    Area cleansed with:  Betadine   Amount of cleaning:  Standard   Visualized foreign bodies/material removed: no   Skin repair:    Repair method:  Steri-Strips   Number of Steri-Strips:  3 Approximation:    Approximation:  Loose   Vermilion border: well-aligned   Post-procedure details:    Dressing:  Open (no dressing)   Patient tolerance of procedure:  Tolerated well, no immediate complications      Critical Care performed: no ____________________________________________   INITIAL IMPRESSION / ASSESSMENT AND PLAN / ED COURSE  Pertinent labs & imaging results that were available during my care of the patient were reviewed by me and considered in my medical decision making (see chart for details).  DDX: sdh, iph, sah, contusion  Katherine Katherine [name] is a 83 y.o. who presents to the ED with unwitnessed fall and concern for Katherine [name] injury as well as laceration as described above. No evidence of other injury. Will order CT scan evaluate for traumatic injury. We'll perform wound care.  Clinical Course as of Jul 13 2152  Mon Jul 13, 2016  2136 CT imaging with no acute traumatic Katherine [name] injury. Patient stable for discharge back to facility.  [PR]    Clinical Course User Index [PR] Willy EddyPatrick Brittinee Risk, MD     ____________________________________________   FINAL CLINICAL IMPRESSION(S) / ED DIAGNOSES  Final diagnoses:  Fall, initial encounter  Laceration of left lower extremity, initial encounter      NEW MEDICATIONS STARTED DURING THIS VISIT:  New Prescriptions   No medications on file     Note:  This document was prepared using Dragon voice recognition software  and may include unintentional dictation errors.    Willy EddyPatrick Atalaya Zappia, MD 07/14/16 [phone]

## 2016-07-13 NOTE — Discharge Instructions (Signed)
Keep wound clean and dry.  Return for fevers, worsening pain, purulent drainage.

## 2016-07-13 NOTE — ED Notes (Signed)
Fall arm band and sucks placed.

## 2016-07-13 NOTE — ED Triage Notes (Signed)
Pt presents to ED via [facility] to be evaluated for unwitnessed fall. Pt found on fall floor matt. Laceration/skin tear noted on lateral left calf, bleeding controlled. No other obvious injuries/complaints noted. Pt on coumadin.

## 2017-01-27 ENCOUNTER — Emergency Department: Payer: Medicare Other

## 2017-01-27 ENCOUNTER — Encounter: Payer: Self-pay | Admitting: Emergency Medicine

## 2017-01-27 ENCOUNTER — Emergency Department
Admission: EM | Admit: 2017-01-27 | Discharge: 2017-01-27 | Disposition: A | Discharge status: Skilled Nursing Facility | Payer: Medicare Other | Attending: Emergency Medicine | Admitting: Emergency Medicine

## 2017-01-27 DIAGNOSIS — I10 Essential (primary) hypertension: Secondary | ICD-10-CM | POA: Diagnosis not present

## 2017-01-27 DIAGNOSIS — Y939 Activity, unspecified: Secondary | ICD-10-CM | POA: Insufficient documentation

## 2017-01-27 DIAGNOSIS — S0003XA Contusion of scalp, initial encounter: Secondary | ICD-10-CM | POA: Insufficient documentation

## 2017-01-27 DIAGNOSIS — J45909 Unspecified asthma, uncomplicated: Secondary | ICD-10-CM | POA: Insufficient documentation

## 2017-01-27 DIAGNOSIS — Z87891 Personal history of nicotine dependence: Secondary | ICD-10-CM | POA: Insufficient documentation

## 2017-01-27 DIAGNOSIS — E039 Hypothyroidism, unspecified: Secondary | ICD-10-CM | POA: Diagnosis not present

## 2017-01-27 DIAGNOSIS — S0990XA Unspecified injury of head, initial encounter: Secondary | ICD-10-CM

## 2017-01-27 DIAGNOSIS — Y999 Unspecified external cause status: Secondary | ICD-10-CM | POA: Diagnosis not present

## 2017-01-27 DIAGNOSIS — N390 Urinary tract infection, site not specified: Secondary | ICD-10-CM | POA: Diagnosis not present

## 2017-01-27 DIAGNOSIS — S0080XA Unspecified superficial injury of other part of head, initial encounter: Secondary | ICD-10-CM | POA: Diagnosis present

## 2017-01-27 DIAGNOSIS — Y92129 Unspecified place in nursing home as the place of occurrence of the external cause: Secondary | ICD-10-CM | POA: Diagnosis not present

## 2017-01-27 DIAGNOSIS — W19XXXA Unspecified fall, initial encounter: Secondary | ICD-10-CM | POA: Insufficient documentation

## 2017-01-27 LAB — URINALYSIS, COMPLETE (UACMP) WITH MICROSCOPIC
Bilirubin Urine: NEGATIVE
GLUCOSE, UA: NEGATIVE mg/dL
HGB URINE DIPSTICK: NEGATIVE
KETONES UR: NEGATIVE mg/dL
Nitrite: POSITIVE — AB
PROTEIN: 30 mg/dL — AB
Specific Gravity, Urine: 1.021 (ref 1.005–1.030)
pH: 5 (ref 5.0–8.0)

## 2017-01-27 LAB — URINALYSIS, MICROSCOPIC (REFLEX)

## 2017-01-27 MED ORDER — BACITRACIN ZINC 500 UNIT/GM EX OINT
TOPICAL_OINTMENT | Freq: Once | CUTANEOUS | Status: AC
Start: 2017-01-27 — End: 2017-01-27
  Administered 2017-01-27: 1 via TOPICAL

## 2017-01-27 MED ORDER — SULFAMETHOXAZOLE-TRIMETHOPRIM 800-160 MG PO TABS: 1.0000 | ORAL_TABLET | Freq: Two times a day (BID) | ORAL | 0 refills | Status: AC

## 2017-01-27 MED ORDER — BACITRACIN ZINC 500 UNIT/GM EX OINT
TOPICAL_OINTMENT | CUTANEOUS | Status: AC
  Administered 2017-01-27: 1 via TOPICAL
  Filled 2017-01-27: qty 0.9

## 2017-01-27 NOTE — ED Triage Notes (Signed)
Pt presents to ED via [facility] from [facility] c/o unwitnessed fall with head involvement. Hematoma noted to forehead, skin tear to R elbow. On coumadin.

## 2017-01-27 NOTE — ED Provider Notes (Signed)
[facility] Emergency Department Provider Note       Time seen: ----------------------------------------- 11:31 AM on 01/27/2017 -----------------------------------------  Level V caveat: History/ROS limited by dementia   I have reviewed the triage vital signs and the nursing notes.   HISTORY   Chief Complaint Fall    HPI Katherine [name] is a 83 y.o. female who presents to the ED for unwitnessed fall at the nursing home. Patient presents with a large frontal scalp hematoma for which she was sent to the ER. She has a chronic indwelling Foley catheter. No further information is available.   Past Medical History:  Diagnosis Date  . Advanced dementia   . Anemia   . Anxiety   . Asthma   . Depression   . DVT (deep vein thrombosis) in pregnancy (HCC)   . Dysphasia   . GERD (gastroesophageal reflux disease)   . Hyperlipidemia   . Hypertension   . Hypothyroidism   . Lumbar scoliosis   . Lumbar stenosis   . Osteoporosis     There are no active problems to display for this patient.   Past Surgical History:  Procedure Laterality Date  . cataract Left   . colonoscopy    . lubar decomp      Allergies Penicillins  Social History Social History  Substance Use Topics  . Smoking status: Former Games developermoker  . Smokeless tobacco: Never Used  . Alcohol use Not on file    Review of Systems  Skin: Positive for hematoma  All systems negative/normal/unremarkable or unknown except as stated in the HPI  ____________________________________________   PHYSICAL EXAM:  VITAL SIGNS: ED Triage Vitals  Enc Vitals Group     BP      Pulse      Resp      Temp      Temp src      SpO2      Weight      Height      Head Circumference      [facility] Flow      Pain Score      Pain Loc      Pain Edu?      Excl. in GC?     Constitutional: Alert But disoriented, no distress Eyes: Conjunctivae are normal. Normal extraocular movements. ENT   Head:  Normocephalic with large frontal scalp hematoma   Nose: No congestion/rhinnorhea.   Mouth/Throat: Mucous membranes are moist.   Neck: No stridor. Cardiovascular: Normal rate, regular rhythm. No murmurs, rubs, or gallops. Respiratory: Normal respiratory effort without tachypnea nor retractions. Breath sounds are clear and equal bilaterally. No wheezes/rales/rhonchi. Gastrointestinal: Soft and nontender. Normal bowel sounds Genitourinary: Foley catheter is present Musculoskeletal: Nontender with normal range of motion in extremities. No lower extremity tenderness nor edema. Neurologic:  Normal speech and language. No gross focal neurologic deficits are appreciated.  Skin:  Anterior frontal scalp hematoma Psychiatric: Mood and affect are normal.  ___________________________________________  ED COURSE:  Pertinent labs & imaging results that were available during my care of the patient were reviewed by me and considered in my medical decision making (see chart for details). Patient presents for a fall out of a wheelchair, we will assess with labs and imaging as indicated.   Procedures ____________________________________________   LABS (pertinent positives/negatives)  Labs Reviewed  URINALYSIS, COMPLETE (UACMP) WITH MICROSCOPIC - Abnormal; Notable for the following:       Result Value   Color, Urine YELLOW (*)    APPearance  TURBID (*)    Protein, ur 30 (*)    Nitrite POSITIVE (*)    Leukocytes, UA MODERATE (*)    All other components within normal limits  URINALYSIS, MICROSCOPIC (REFLEX) - Abnormal; Notable for the following:    Bacteria, UA FEW (*)    Squamous Epithelial / LPF TOO NUMEROUS TO COUNT (*)    Non Squamous Epithelial PRESENT (*)    All other components within normal limits  URINE CULTURE    RADIOLOGY Images were viewed by me  CT head IMPRESSION: Stable ventriculomegaly is noted concerning for normal pressure hydrocephalus. 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  Mild left frontal scalp hematoma. No other significant change compared to prior exam. ____________________________________________  FINAL ASSESSMENT AND PLAN  Fall, head injury, UTI  Plan: Patient's labs and imaging were dictated above. Patient had presented for a fall. She does have advanced dementia and CT was negative for any acute process. She does have stable chronic ventriculomegaly. We did change out her Foley catheter and she likely has a UTI and will be started on Septra for coverage of urine bacteria previously sensitive to same.   Emily Filbert, MD   Note: This note was generated in part or whole with voice recognition software. Voice recognition is usually quite accurate but there are transcription errors that can and very often do occur. I apologize for any typographical errors that were not detected and corrected.     Emily Filbert, MD 01/27/17 [phone]

## 2017-01-27 NOTE — ED Notes (Signed)
Called for EMS transport   [phone]

## 2017-01-30 LAB — URINE CULTURE
Culture: >=100000 — AB
Special Requests: NORMAL

## 2017-01-31 NOTE — Progress Notes (Signed)
Patient was seen in the ED and a urine culture was done on 8/15. Urine Cx resulted on 8/19 showing >10^5 CFU Ecoli AND Enterococcus faecalis. Patient was discharged from the ED with Bactrim DS 800/160mg  PO 1 tab BID x 10 days. Ecoli was shown to be susceptible to bactrim. She did not receive any antibiotics that would cover her enterococcus. Patient currently lives at [facility]. Patient's culture report was faxed to the facility on 8/19 and the nurse at the facility was informed that patient may need additional antibiotics if her symptoms have not improved. The fax number used was [phone].   Yolanda Bonine, PharmD Pharmacy Resident  01/31/2017 [phone]

## 2017-08-07 ENCOUNTER — Other Ambulatory Visit: Payer: Self-pay

## 2017-08-07 ENCOUNTER — Emergency Department
Admission: EM | Admit: 2017-08-07 | Discharge: 2017-08-07 | Disposition: A | Discharge status: Home / Self Care | Payer: Medicare Other | Attending: Emergency Medicine | Admitting: Emergency Medicine

## 2017-08-07 ENCOUNTER — Encounter: Payer: Self-pay | Admitting: Emergency Medicine

## 2017-08-07 ENCOUNTER — Emergency Department: Payer: Medicare Other

## 2017-08-07 DIAGNOSIS — R4182 Altered mental status, unspecified: Secondary | ICD-10-CM | POA: Diagnosis not present

## 2017-08-07 DIAGNOSIS — I10 Essential (primary) hypertension: Secondary | ICD-10-CM | POA: Insufficient documentation

## 2017-08-07 DIAGNOSIS — F0390 Unspecified dementia without behavioral disturbance: Secondary | ICD-10-CM | POA: Diagnosis not present

## 2017-08-07 DIAGNOSIS — R509 Fever, unspecified: Secondary | ICD-10-CM | POA: Diagnosis present

## 2017-08-07 DIAGNOSIS — Z79899 Other long term (current) drug therapy: Secondary | ICD-10-CM | POA: Insufficient documentation

## 2017-08-07 DIAGNOSIS — J45909 Unspecified asthma, uncomplicated: Secondary | ICD-10-CM | POA: Diagnosis not present

## 2017-08-07 DIAGNOSIS — R791 Abnormal coagulation profile: Secondary | ICD-10-CM | POA: Diagnosis not present

## 2017-08-07 DIAGNOSIS — N390 Urinary tract infection, site not specified: Secondary | ICD-10-CM | POA: Diagnosis not present

## 2017-08-07 DIAGNOSIS — E039 Hypothyroidism, unspecified: Secondary | ICD-10-CM | POA: Diagnosis not present

## 2017-08-07 LAB — COMPREHENSIVE METABOLIC PANEL
ALBUMIN: 2.9 g/dL — AB (ref 3.5–5.0)
ALK PHOS: 69 U/L (ref 38–126)
ALT: 33 U/L (ref 14–54)
ANION GAP: 8 (ref 5–15)
AST: 29 U/L (ref 15–41)
BILIRUBIN TOTAL: 0.4 mg/dL (ref 0.3–1.2)
BUN: 33 mg/dL — AB (ref 6–20)
CALCIUM: 8.4 mg/dL — AB (ref 8.9–10.3)
CO2: 22 mmol/L (ref 22–32)
CREATININE: 1.1 mg/dL — AB (ref 0.44–1.00)
Chloride: 115 mmol/L — ABNORMAL HIGH (ref 101–111)
GFR calc Af Amer: 50 mL/min — ABNORMAL LOW (ref >60)
GFR calc non Af Amer: 43 mL/min — ABNORMAL LOW (ref >60)
GLUCOSE: 94 mg/dL (ref 65–99)
Potassium: 4.1 mmol/L (ref 3.5–5.1)
Sodium: 145 mmol/L (ref 135–145)
Total Protein: 5.8 g/dL — ABNORMAL LOW (ref 6.5–8.1)

## 2017-08-07 LAB — PROTIME-INR
INR: 4.17
Prothrombin Time: 40 seconds — ABNORMAL HIGH (ref 11.4–15.2)

## 2017-08-07 LAB — URINALYSIS, MICROSCOPIC (REFLEX)
BACTERIA UA: NONE SEEN
Budding Yeast: NONE SEEN
WBC CLUMPS: NONE SEEN

## 2017-08-07 LAB — CBC
HCT: 31.8 % — ABNORMAL LOW (ref 35.0–47.0)
Hemoglobin: 10.6 g/dL — ABNORMAL LOW (ref 12.0–16.0)
MCH: 30.7 pg (ref 26.0–34.0)
MCHC: 33.2 g/dL (ref 32.0–36.0)
MCV: 92.5 fL (ref 80.0–100.0)
PLATELETS: 177 10*3/uL (ref 150–440)
RBC: 3.44 MIL/uL — ABNORMAL LOW (ref 3.80–5.20)
RDW: 16.1 % — AB (ref 11.5–14.5)
WBC: 7 10*3/uL (ref 3.6–11.0)

## 2017-08-07 LAB — URINALYSIS, ROUTINE W REFLEX MICROSCOPIC
BILIRUBIN URINE: NEGATIVE
Glucose, UA: NEGATIVE mg/dL
KETONES UR: 5 mg/dL — AB
NITRITE: POSITIVE — AB
Protein, ur: NEGATIVE mg/dL
SPECIFIC GRAVITY, URINE: 1.019 (ref 1.005–1.030)
pH: 5 (ref 5.0–8.0)

## 2017-08-07 LAB — GLUCOSE, CAPILLARY: Glucose-Capillary: 86 mg/dL (ref 65–99)

## 2017-08-07 LAB — VALPROIC ACID LEVEL: Valproic Acid Lvl: 20 ug/mL — ABNORMAL LOW (ref 50.0–100.0)

## 2017-08-07 LAB — TROPONIN I

## 2017-08-07 LAB — LACTIC ACID, PLASMA: LACTIC ACID, VENOUS: 0.8 mmol/L (ref 0.5–1.9)

## 2017-08-07 MED ORDER — SODIUM CHLORIDE 0.9 % IV SOLN
2.0000 g | Freq: Once | INTRAVENOUS | Status: AC
  Administered 2017-08-07: 2 g via INTRAVENOUS
  Filled 2017-08-07: qty 2

## 2017-08-07 MED ORDER — SODIUM CHLORIDE 0.9 % IV SOLN
1.0000 g | Freq: Once | INTRAVENOUS | Status: AC
  Administered 2017-08-07: 1 g via INTRAVENOUS
  Filled 2017-08-07: qty 10

## 2017-08-07 MED ORDER — SODIUM CHLORIDE 0.9 % IV SOLN: 1.0000 g | Freq: Three times a day (TID) | INTRAVENOUS | Status: DC

## 2017-08-07 MED ORDER — CEFDINIR 300 MG PO CAPS: 300.0000 mg | ORAL_CAPSULE | Freq: Two times a day (BID) | ORAL | 0 refills | Status: AC

## 2017-08-07 MED ORDER — LEVOFLOXACIN IN D5W 250 MG/50ML IV SOLN: 250.0000 mg | INTRAVENOUS | Status: DC

## 2017-08-07 MED ORDER — LEVOFLOXACIN IN D5W 750 MG/150ML IV SOLN
750.0000 mg | Freq: Once | INTRAVENOUS | Status: AC
  Administered 2017-08-07: 750 mg via INTRAVENOUS
  Filled 2017-08-07: qty 150

## 2017-08-07 MED ORDER — SODIUM CHLORIDE 0.9 % IV BOLUS (SEPSIS)
1000.0000 mL | Freq: Once | INTRAVENOUS | Status: AC
  Administered 2017-08-07: 1000 mL via INTRAVENOUS

## 2017-08-07 NOTE — Progress Notes (Signed)
CODE SEPSIS - PHARMACY COMMUNICATION  **Broad Spectrum Antibiotics should be administered within 1 hour of Sepsis diagnosis**  Time Code Sepsis Called/Page Received: [id number]  Antibiotics Ordered: aztreonam and levofloxacin  Time of 1st antibiotic administration: aztreonam at 1024  Additional action taken by pharmacy: n/a  If necessary, Name of Provider/Nurse Contacted: n/a    Marty HeckWang, Belvin Gauss L ,PharmD Clinical Pharmacist  08/07/2017  10:17 AM

## 2017-08-07 NOTE — ED Provider Notes (Signed)
[facility] Emergency Department Provider Note  ___________________________________________   First MD Initiated Contact with Patient 08/07/17 [phone]     (approximate)  I have reviewed the triage vital signs and the nursing notes.   HISTORY  Chief Complaint Altered Mental Status   HPI VISTA SAWATZKY is a 84 y.o. female history of dementia as well as DVT on Coumadin who is presenting to the emergency department altered mental status as well as a fever.  The patient was in coded as an emergency traffic patient by EMS for being unresponsive.  Per EMS, the patient was last seen at her baseline yesterday.  EMS reports that the patient's baseline is yelling "help me, help me."  The patient is non-conversive at her baseline per EMS.  Patient also presents with a most form listing the patient is a DNR and comfort measures but with antibiotics and fluids as needed.  Patient unable to give further history at this time.  EMS reported that the patient was 83% on room air when they arrived and so the patient was started on nasal cannula oxygen.  EMS also reported that the patient had pinpoint pupils upon arrival and so was given 1 mg of Narcan without improvement in mental status.  EMS is also noted a possible facial droop on the right.  Past Medical History:  Diagnosis Date  . Advanced dementia   . Anemia   . Anxiety   . Asthma   . Depression   . DVT (deep vein thrombosis) in pregnancy (HCC)   . Dysphasia   . GERD (gastroesophageal reflux disease)   . Hyperlipidemia   . Hypertension   . Hypothyroidism   . Lumbar scoliosis   . Lumbar stenosis   . Osteoporosis     There are no active problems to display for this patient.   Past Surgical History:  Procedure Laterality Date  . cataract Left   . colonoscopy    . lubar decomp      Prior to Admission medications   Medication Sig Start Date End Date Taking? Authorizing Provider  sulfamethoxazole-trimethoprim  (BACTRIM DS) 800-160 MG tablet Take 1 tablet by mouth 2 (two) times daily. 01/27/17   Emily Filbert, MD    Allergies Penicillins  No family history on file.  Social History Social History   Tobacco Use  . Smoking status: Former Games developer  . Smokeless tobacco: Never Used  Substance Use Topics  . Alcohol use: Not on file  . Drug use: Not on file    Review of Systems Level 5 caveat   ____________________________________________   PHYSICAL EXAM:  VITAL SIGNS: ED Triage Vitals [08/07/17 0953]  Enc Vitals Group     BP      Pulse      Resp 18     Temp 99.3 F (37.4 C)     Temp Source Rectal     SpO2      Weight      Height      Head Circumference      [facility]      Pain Score      Pain Loc      Pain Edu?      Excl. in GC?     Constitutional: Alert with eyes open and appears to be looking around the room.  Moves upper extremities occasionally but not purposefully. Eyes: Conjunctivae are normal.  Head: Atraumatic. Nose: No congestion/rhinnorhea. Mouth/Throat: Mucous membranes are moist.  Neck: No stridor.  Cardiovascular: Normal rate, regular rhythm. Grossly normal heart sounds.   Respiratory: Normal respiratory effort.  No retractions. Lungs CTAB. Gastrointestinal: Soft and nontender. No distention.   In indwelling Foley with hazy, opacified urine.  Musculoskeletal: No lower extremity tenderness nor edema.  No joint effusions. Neurologic: No gross focal neurologic deficits are appreciated.  I do not see any obvious neurologic deficit including a right-sided facial droop.  The patient is leaning to her right and this may be why the face appears drooping. Skin:  Skin is warm, dry and intact. No rash noted.   ____________________________________________   LABS (all labs ordered are listed, but only abnormal results are displayed)  Labs Reviewed  COMPREHENSIVE METABOLIC PANEL - Abnormal; Notable for the following components:      Result Value    Chloride 115 (*)    BUN 33 (*)    Creatinine, Ser 1.10 (*)    Calcium 8.4 (*)    Total Protein 5.8 (*)    Albumin 2.9 (*)    GFR calc non Af Amer 43 (*)    GFR calc Af Amer 50 (*)    All other components within normal limits  CBC - Abnormal; Notable for the following components:   RBC 3.44 (*)    Hemoglobin 10.6 (*)    HCT 31.8 (*)    RDW 16.1 (*)    All other components within normal limits  URINALYSIS, ROUTINE W REFLEX MICROSCOPIC - Abnormal; Notable for the following components:   Color, Urine YELLOW (*)    APPearance CLOUDY (*)    Hgb urine dipstick SMALL (*)    Ketones, ur 5 (*)    Nitrite POSITIVE (*)    Leukocytes, UA SMALL (*)    All other components within normal limits  PROTIME-INR - Abnormal; Notable for the following components:   Prothrombin Time 40.0 (*)    INR 4.17 (*)    All other components within normal limits  VALPROIC ACID LEVEL - Abnormal; Notable for the following components:   Valproic Acid Lvl 20 (*)    All other components within normal limits  URINALYSIS, MICROSCOPIC (REFLEX) - Abnormal; Notable for the following components:   Squamous Epithelial / LPF 6-30 (*)    Non Squamous Epithelial 0-5 (*)    All other components within normal limits  CULTURE, BLOOD (ROUTINE X 2)  CULTURE, BLOOD (ROUTINE X 2)  LACTIC ACID, PLASMA  TROPONIN I  GLUCOSE, CAPILLARY  LACTIC ACID, PLASMA  CBG MONITORING, ED   ____________________________________________  EKG  ED ECG REPORT I, Arelia Longestavid M Waverly Tarquinio, the attending physician, personally viewed and interpreted this ECG.   Date: 08/07/2017  EKG Time: 0956  Rate: 68  Rhythm: normal sinus rhythm  Axis: Normal  Intervals:none  ST&T Change: No ST segment elevation or depression.  No abnormal T wave inversion.  ____________________________________________  RADIOLOGY  CT head without acute process.  Also without acute process that is obvious on the chest  x-ray. ____________________________________________   PROCEDURES  Procedure(s) performed:   Procedures  Critical Care performed:   ____________________________________________   INITIAL IMPRESSION / ASSESSMENT AND PLAN / ED COURSE  Pertinent labs & imaging results that were available during my care of the patient were reviewed by me and considered in my medical decision making (see chart for details).  Differential diagnosis includes, but is not limited to, alcohol, illicit or prescription medications, or other toxic ingestion; intracranial pathology such as stroke or intracerebral hemorrhage; fever or infectious causes including sepsis; hypoxemia and/or  hypercarbia; uremia; trauma; endocrine related disorders such as diabetes, hypoglycemia, and thyroid-related diseases; hypertensive encephalopathy; etc.  As part of my medical decision making, I reviewed the following data within the electronic MEDICAL RECORD NUMBER Old chart reviewed and Notes from prior ED visits   ----------------------------------------- 1:06 PM on 08/07/2017 -----------------------------------------  Patient's family is now at the bedside and says that the patient is at her baseline.  The patient has been intermittently saying "help me, help me."   Patient also intermittently napping.  The family says that she has in the mornings in the past been difficulty to arouse and it is typically been associated with an infection or "something medical."  The patient has a most form that lists her being a DNR as well as IV fluids and antibiotics only as needed.  I discussed admitting the patient to the hospital versus discharging.  We discussed that the patient is very far along in her disease course with her dementia and that it is possible that she could worsen but that the patient would likely be most comfortable and in a familiar and nonhospital setting.  The patient will be given a dose of ceftriaxone.  The family says that  they know that she has this listed  penicillin allergy but they are unfamiliar with any severe anaphylactic reaction.  Cultures reviewed of past UTIs.  Foley changed.  Patient will be discharged with Ceftin ear.  Family is understanding of this plan willing to comply.  Labs have been very reassuring including a normal white blood cell count.  Afebrile patient here with blood pressures above 100 and a normal heart rate.  INR was elevated but there is a plan already in place to hold the Coumadin both today and tomorrow.  I believe this will be an appropriate pause in her dosing but given the INR reading at this time.  Family also aware of the INR and the plan for holding Coumadin. ____________________________________________   FINAL CLINICAL IMPRESSION(S) / ED DIAGNOSES  UTI.  Altered mental status.    NEW MEDICATIONS STARTED DURING THIS VISIT:  New Prescriptions   No medications on file     Note:  This document was prepared using Dragon voice recognition software and may include unintentional dictation errors.     Myrna Blazer, MD 08/07/17 [phone]

## 2017-08-07 NOTE — ED Notes (Signed)
Code sepsis called to [facility] 

## 2017-08-07 NOTE — Progress Notes (Signed)
Pharmacy Antibiotic Note  Katherine [name] is a 84 y.o. female admitted on 08/07/2017 with UTI.  Pharmacy has been consulted for aztreonam and levofloxacin dosing.  Plan: 1. Aztreonam 1 gm IV Q8H 2. Levofloxacin 250 mg IV Q24H  Height: 5\' 7"  (170.2 cm) Weight: 130 lb (59 kg) IBW/kg (Calculated) : 61.6  Temp (24hrs), Avg:99.3 F (37.4 C), Min:99.3 F (37.4 C), Max:99.3 F (37.4 C)  Recent Labs  Lab 08/07/17 0951 08/07/17 0952  WBC 7.0  --   CREATININE 1.10*  --   LATICACIDVEN  --  0.8    Estimated Creatinine Clearance: 32.3 mL/min (A) (by C-G formula based on SCr of 1.1 mg/dL (H)).    Allergies  Allergen Reactions  . Penicillins     Antimicrobials this admission: ED: Aztreonam 2 gm x 1, Levofloxacin 750 mg x 1, ceftriaxone 1 gm x 1 08/07/17 Inpatient:  Dose adjustments this admission: CrCl 30 to 35 mL/min - levofloxacin 250 mg IV Q24H for UTI  Microbiology results: 08/07/17 BCx: pending UCx: N/A  Sputum: N/A  MRSA PCR: N/A  Thank you for allowing pharmacy to be a part of this patient's care.  Carola FrostNathan A Landrie Beale, Pharm.D., BCPS Clinical Pharmacist 08/07/2017 1:36 PM

## 2017-08-07 NOTE — ED Notes (Signed)
EMS called to [facility]

## 2017-08-07 NOTE — ED Notes (Signed)
Report called to Bel[facility] at [facility].

## 2017-08-07 NOTE — ED Triage Notes (Signed)
Pt to ED via [facility] from [facility] for Altered mental status. Last seem normal was yesterday. Per [facility] staff [facility] reports that pt has hx/o dementia but is usually able to call out for help, this morning pt was less responsive to staff. EMS gave 1 mg of Narcan due to pt having pin point pupils. Upon arrival to ED pt in NAD. Alert but non verbal.

## 2017-08-08 LAB — BLOOD CULTURE ID PANEL (REFLEXED)
Acinetobacter baumannii: NOT DETECTED
CANDIDA ALBICANS: NOT DETECTED
CANDIDA GLABRATA: NOT DETECTED
CANDIDA PARAPSILOSIS: NOT DETECTED
CANDIDA TROPICALIS: NOT DETECTED
Candida krusei: NOT DETECTED
ENTEROBACTER CLOACAE COMPLEX: NOT DETECTED
ENTEROBACTERIACEAE SPECIES: NOT DETECTED
Enterococcus species: NOT DETECTED
Escherichia coli: NOT DETECTED
Haemophilus influenzae: NOT DETECTED
KLEBSIELLA OXYTOCA: NOT DETECTED
KLEBSIELLA PNEUMONIAE: NOT DETECTED
Listeria monocytogenes: NOT DETECTED
Methicillin resistance: DETECTED — AB
Neisseria meningitidis: NOT DETECTED
Proteus species: NOT DETECTED
Pseudomonas aeruginosa: NOT DETECTED
STREPTOCOCCUS PNEUMONIAE: NOT DETECTED
STREPTOCOCCUS PYOGENES: NOT DETECTED
Serratia marcescens: NOT DETECTED
Staphylococcus aureus (BCID): NOT DETECTED
Staphylococcus species: DETECTED — AB
Streptococcus agalactiae: NOT DETECTED
Streptococcus species: NOT DETECTED

## 2017-08-08 NOTE — Progress Notes (Signed)
PHARMACY - PHYSICIAN COMMUNICATION CRITICAL VALUE ALERT - BLOOD CULTURE IDENTIFICATION (BCID)  Katherine BitterHelen J [name] is an 84 y.o. female who presented to [facility] on 08/07/2017 with a chief complaint of   Assessment:  BCID 1/4 anaerobic Staph spp mecA(+)  (include suspected source if known)  Name of physician (or Provider) Contacted: ED charge  Current antibiotics: d/c from ED  Changes to prescribed antibiotics recommended:   No results found for this or any previous visit.  [name] 08/08/2017  6:33 AM

## 2017-08-08 NOTE — ED Provider Notes (Signed)
Patient with blood cultures positive for gram-positive cocci in the anaerobic bottle.  No organism ID or susceptibilities at this time.  I called the patient's skilled nursing facility and spoke with the patient's nurse, Burnett HarryShelly, who says that the patient is not running a fever and is acting at her baseline.  Likely skin contaminant.  I discussed with the nurse that the patient to be monitored for any changes in behavior or fever.  However, for now we will continue the course of her p.o. antibiotics.   Myrna BlazerSchaevitz, David Matthew, MD 08/08/17 [phone]

## 2017-08-11 LAB — CULTURE, BLOOD (ROUTINE X 2)
SPECIAL REQUESTS: ADEQUATE
Special Requests: ADEQUATE

## 2017-08-12 NOTE — Progress Notes (Signed)
ED Antimicrobial Stewardship Positive Culture Follow Up   Katherine [name] is an 84 y.o. female who presented to [facility] on 08/07/2017 with a chief complaint of  Chief Complaint  Patient presents with  . Altered Mental Status    Recent Results (from the past 720 hour(s))  Blood Culture (routine x 2)     Status: Abnormal   Collection Time: 08/07/17  9:59 AM  Result Value Ref Range Status   Specimen Description   Final    BLOOD BLOOD LEFT FOREARM Performed at [facility], [address] [place], [place] [address]    Special Requests   Final    BOTTLES DRAWN AEROBIC AND ANAEROBIC Blood Culture adequate volume Performed at [facility], [address] [place], [place] [address]    Culture  Setup Time   Final    ANAEROBIC BOTTLE ONLY GRAM POSITIVE COCCI IN CLUSTERS CRITICAL VALUE NOTED.  VALUE IS CONSISTENT WITH PREVIOUSLY REPORTED AND CALLED VALUE. Performed at [facility], [address] [place], [place] [address]    Culture (A)  Final    STAPHYLOCOCCUS SPECIES (COAGULASE NEGATIVE) SUSCEPTIBILITIES PERFORMED ON PREVIOUS CULTURE WITHIN THE LAST 5 DAYS. Performed at [facility], [address] [place], [place] [address]    Report Status 08/11/2017 FINAL  Final  Blood Culture (routine x 2)     Status: Abnormal   Collection Time: 08/07/17 10:04 AM  Result Value Ref Range Status   Specimen Description BLOOD RIGHT HAND  Final   Special Requests   Final    BOTTLES DRAWN AEROBIC AND ANAEROBIC Blood Culture adequate volume   Culture  Setup Time   Final    GRAM POSITIVE COCCI IN BOTH AEROBIC AND ANAEROBIC BOTTLES CRITICAL RESULT CALLED TO, READ BACK BY AND VERIFIED WITH: MATT MCBANE AT 0630 ON 08/08/17 BY SNJ Performed at [facility], [address] [place], [place] [address]    Culture STAPHYLOCOCCUS SPECIES (COAGULASE NEGATIVE) (A)  Final   Report Status 08/11/2017 FINAL  Final   Organism ID, Bacteria STAPHYLOCOCCUS SPECIES (COAGULASE  NEGATIVE)  Final      Susceptibility   Staphylococcus species (coagulase negative) - MIC*    CIPROFLOXACIN 4 RESISTANT Resistant     ERYTHROMYCIN >=8 RESISTANT Resistant     GENTAMICIN <=0.5 SENSITIVE Sensitive     OXACILLIN <=0.25 SENSITIVE Sensitive     TETRACYCLINE <=1 SENSITIVE Sensitive     VANCOMYCIN <=0.5 SENSITIVE Sensitive     TRIMETH/SULFA <=10 SENSITIVE Sensitive     CLINDAMYCIN INTERMEDIATE Intermediate     RIFAMPIN <=0.5 SENSITIVE Sensitive     Inducible Clindamycin NEGATIVE Sensitive     * STAPHYLOCOCCUS SPECIES (COAGULASE NEGATIVE)  Blood Culture ID Panel (Reflexed)     Status: Abnormal   Collection Time: 08/07/17 10:04 AM  Result Value Ref Range Status   Enterococcus species NOT DETECTED NOT DETECTED Final   Listeria monocytogenes NOT DETECTED NOT DETECTED Final   Staphylococcus species DETECTED (A) NOT DETECTED Final    Comment: Methicillin (oxacillin) resistant coagulase negative staphylococcus. Possible blood culture contaminant (unless isolated from more than one blood culture draw or clinical case suggests pathogenicity). No antibiotic treatment is indicated for blood  culture contaminants. CRITICAL RESULT CALLED TO, READ BACK BY AND VERIFIED WITH: MATT MCBANE AT 0630 ON 08/08/17 BY SNJ    Staphylococcus aureus NOT DETECTED NOT DETECTED Final   Methicillin resistance DETECTED (A) NOT DETECTED Final    Comment: CRITICAL RESULT CALLED  TO, READ BACK BY AND VERIFIED WITH: MATT MCBANE AT 0630 ON 08/08/17 BY SNJ    Streptococcus species NOT DETECTED NOT DETECTED Final   Streptococcus agalactiae NOT DETECTED NOT DETECTED Final   Streptococcus pneumoniae NOT DETECTED NOT DETECTED Final   Streptococcus pyogenes NOT DETECTED NOT DETECTED Final   Acinetobacter baumannii NOT DETECTED NOT DETECTED Final   Enterobacteriaceae species NOT DETECTED NOT DETECTED Final   Enterobacter cloacae complex NOT DETECTED NOT DETECTED Final   Escherichia coli NOT DETECTED NOT DETECTED  Final   Klebsiella oxytoca NOT DETECTED NOT DETECTED Final   Klebsiella pneumoniae NOT DETECTED NOT DETECTED Final   Proteus species NOT DETECTED NOT DETECTED Final   Serratia marcescens NOT DETECTED NOT DETECTED Final   Haemophilus influenzae NOT DETECTED NOT DETECTED Final   Neisseria meningitidis NOT DETECTED NOT DETECTED Final   Pseudomonas aeruginosa NOT DETECTED NOT DETECTED Final   Candida albicans NOT DETECTED NOT DETECTED Final   Candida glabrata NOT DETECTED NOT DETECTED Final   Candida krusei NOT DETECTED NOT DETECTED Final   Candida parapsilosis NOT DETECTED NOT DETECTED Final   Candida tropicalis NOT DETECTED NOT DETECTED Final    Comment: Performed at [facility], [address], [place], [place] [address]   Spoke with RN at [facility] where patient is a resident. Per RN - patient is afebrile and there is an MD available on site to review culture results. Faxed culture results to [phone] number provided.  Cindi Carbon, PharmD 08/12/17 11:59 AM

## 2017-12-03 ENCOUNTER — Other Ambulatory Visit
Admission: RE | Admit: 2017-12-03 | Discharge: 2017-12-03 | Disposition: A | Discharge status: Home / Self Care | Payer: Medicare Other | Source: Ambulatory Visit | Attending: Family Medicine | Admitting: Family Medicine

## 2017-12-03 DIAGNOSIS — N390 Urinary tract infection, site not specified: Secondary | ICD-10-CM | POA: Diagnosis present

## 2017-12-03 LAB — URINALYSIS, COMPLETE (UACMP) WITH MICROSCOPIC
Bilirubin Urine: NEGATIVE
Glucose, UA: NEGATIVE mg/dL
KETONES UR: 20 mg/dL — AB
Nitrite: NEGATIVE
PH: 8 (ref 5.0–8.0)
PROTEIN: 100 mg/dL — AB
Specific Gravity, Urine: 1.021 (ref 1.005–1.030)

## 2017-12-05 ENCOUNTER — Other Ambulatory Visit
Admission: RE | Admit: 2017-12-05 | Discharge: 2017-12-05 | Disposition: A | Discharge status: Home / Self Care | Payer: Medicare Other | Source: Ambulatory Visit | Attending: Family Medicine | Admitting: Family Medicine

## 2017-12-05 DIAGNOSIS — M6281 Muscle weakness (generalized): Secondary | ICD-10-CM | POA: Diagnosis present

## 2017-12-05 LAB — URINALYSIS, COMPLETE (UACMP) WITH MICROSCOPIC
GLUCOSE, UA: NEGATIVE mg/dL
Hgb urine dipstick: NEGATIVE
Ketones, ur: 5 mg/dL — AB
Leukocytes, UA: NEGATIVE
Nitrite: NEGATIVE
Protein, ur: >=300 mg/dL — AB
SPECIFIC GRAVITY, URINE: 1.022 (ref 1.005–1.030)
pH: 9 — ABNORMAL HIGH (ref 5.0–8.0)

## 2017-12-05 LAB — URINALYSIS, MICROSCOPIC (REFLEX)

## 2017-12-06 LAB — URINE CULTURE: Culture: >=100000 — AB

## 2018-04-15 DEATH — deceased

## 2018-08-29 IMAGING — CT CT HEAD W/O CM
3 series · 15 of 46 positions shown, 18 images · non-contrast
Comparison: CT head dated January 27, 2017.

CLINICAL DATA: Altered mental status.  History of dementia.

EXAM:
CT HEAD WITHOUT CONTRAST
TECHNIQUE: Contiguous axial images were obtained from the base of the skull
through the vertex without intravenous contrast.

[Series 2: head wo · axial · 0.42mm/px · z∈[+454,+574]mm · 9 of 29 slices shown, 12 images]
[im 3/29  brain]
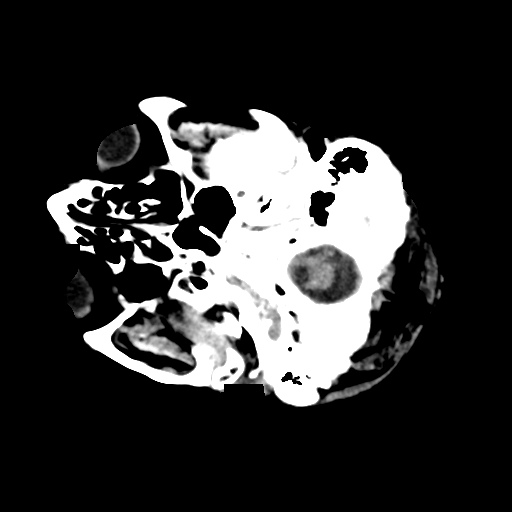
[im 3/29  bone]
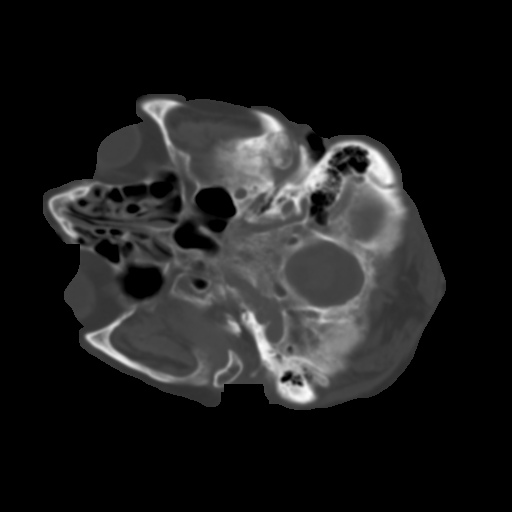
[im 6/29  brain]
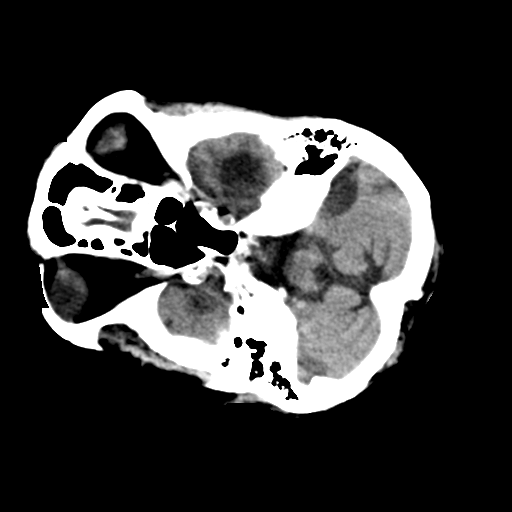
[im 9/29  brain]
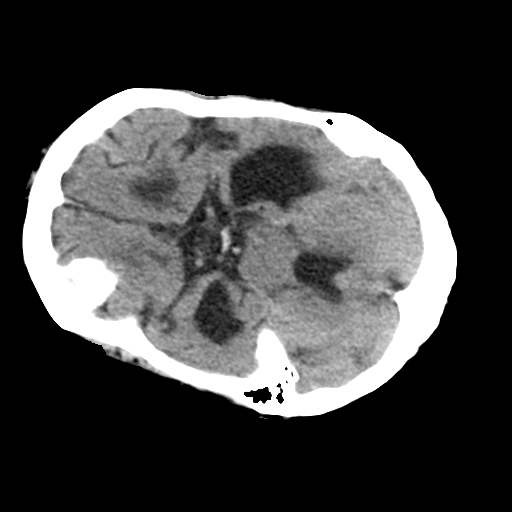
[im 12/29  brain]
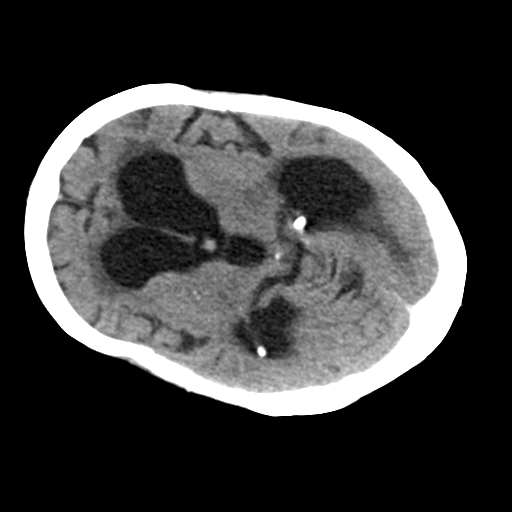
[im 15/29  brain]
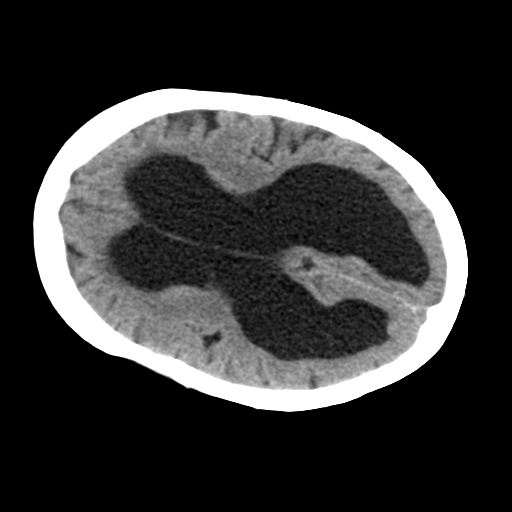
[im 15/29  bone]
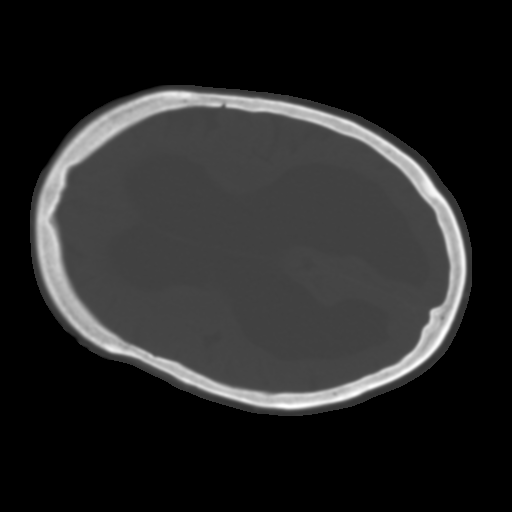
[im 18/29  brain]
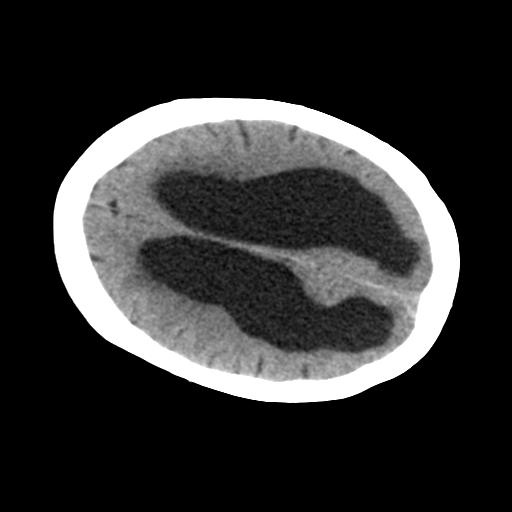
[im 21/29  brain]
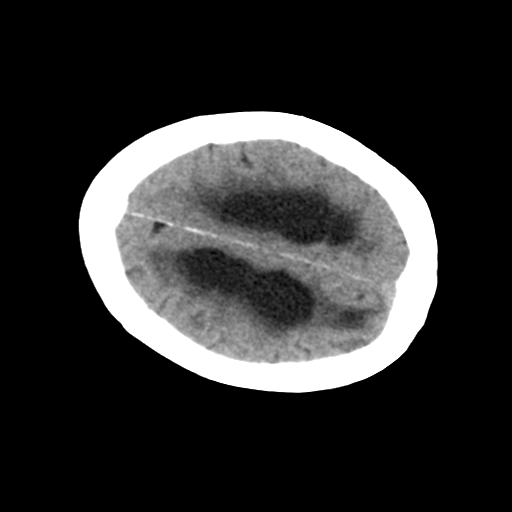
[im 24/29  brain]
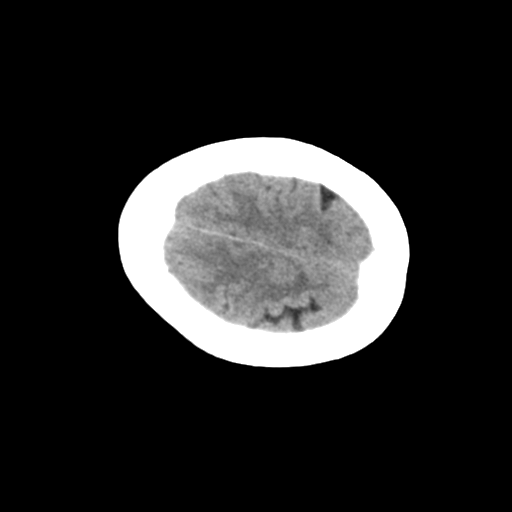
[im 27/29  brain]
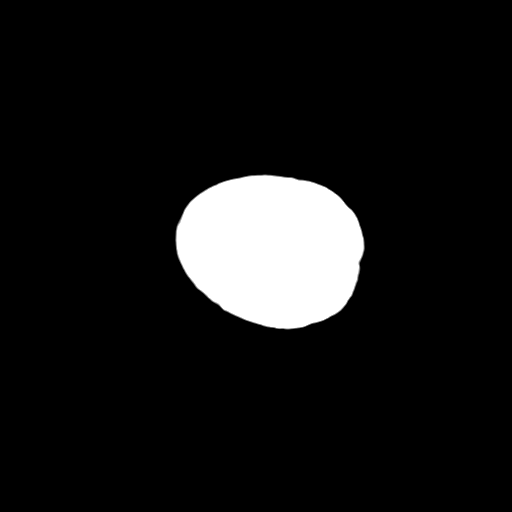
[im 27/29  bone]
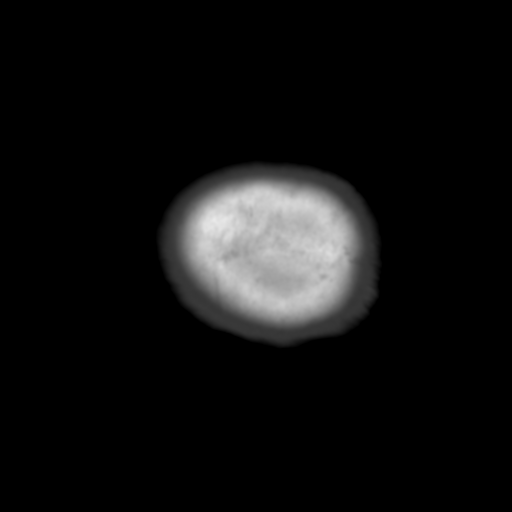

[Series 6: sagittal soft tissue · coronal · 0.29mm/px · 3 of 56 slices shown]
[im 19/56  brain]
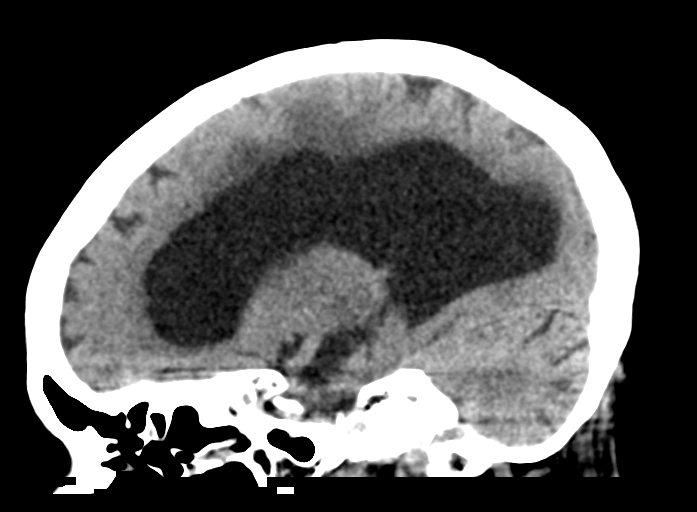
[im 25/56  brain]
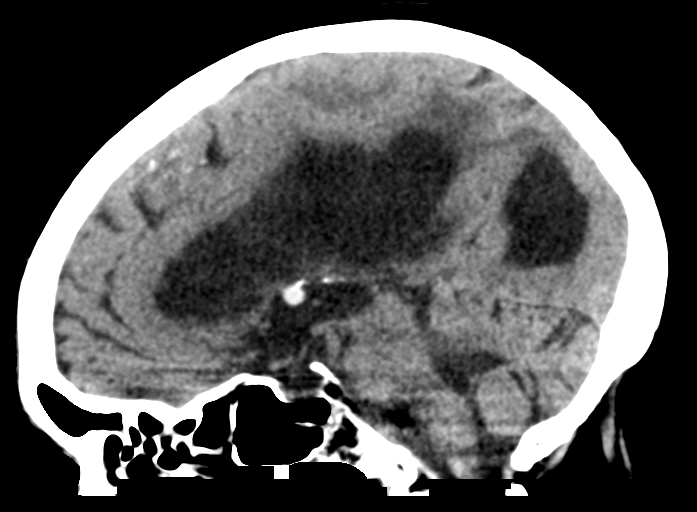
[im 31/56  brain]
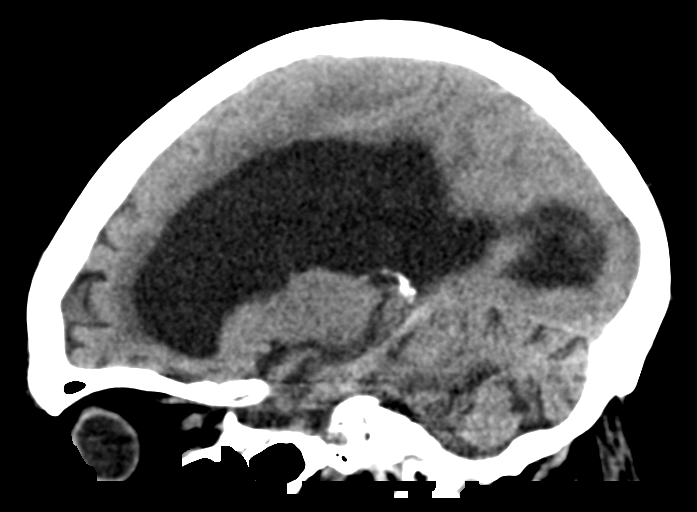

[Series 7: cor soft tissue · sagittal · 0.30mm/px · 3 of 67 slices shown]
[im 23/67  brain]
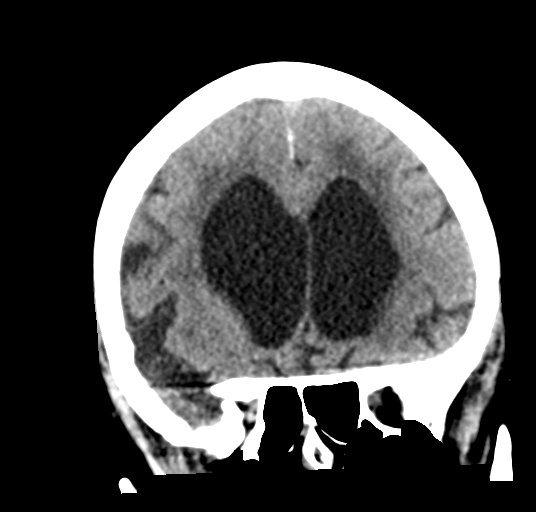
[im 34/67  brain]
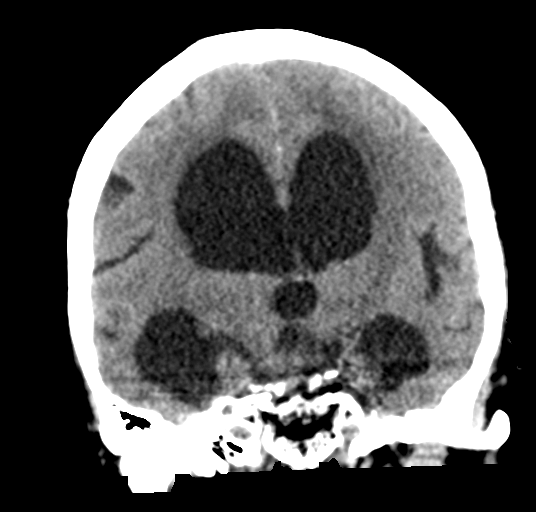
[im 45/67  brain]
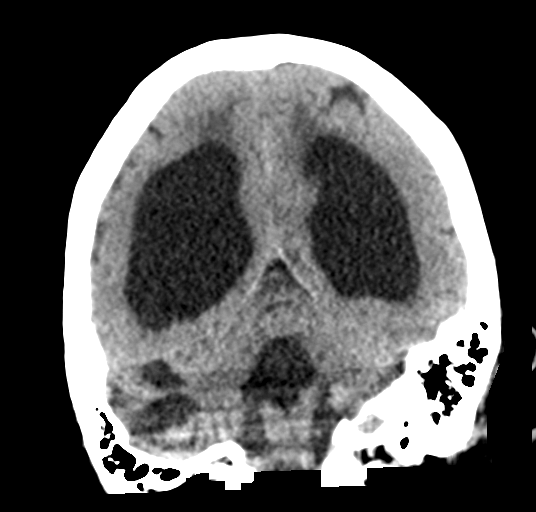

[15 of 46 positions shown; findings below may reference images not displayed]

FINDINGS: Brain: No evidence of acute infarction, hemorrhage, or extra-axial
collection. Unchanged severe ventriculomegaly. Stable 6 mm
hyperdense nodule near the foramen of Varkey, likely a colloid cyst..

Vascular: Atherosclerotic vascular calcification of the carotid
siphons. No hyperdense vessel.

Skull: Normal. Negative for fracture or focal lesion.

Sinuses/Orbits: No acute finding.

Other: None.
IMPRESSION: 1.  No acute intracranial abnormality.
2. Stable severe chronic ventriculomegaly, favored to reflect normal
pressure hydrocephalus.
3. Unchanged 6 mm colloid cyst in the foramen of Varkey.
